# Patient Record
Sex: Male | Born: 1975 | Hispanic: No | Marital: Married | Smoking: Light tobacco smoker
Health system: Southern US, Community
[De-identification: ages and names within clinical notes are randomized; demographics above are authoritative.]

## PROBLEM LIST (undated history)

## (undated) DIAGNOSIS — K529 Noninfective gastroenteritis and colitis, unspecified: Secondary | ICD-10-CM

## (undated) DIAGNOSIS — J45909 Unspecified asthma, uncomplicated: Secondary | ICD-10-CM

## (undated) DIAGNOSIS — K51911 Ulcerative colitis, unspecified with rectal bleeding: Secondary | ICD-10-CM

## (undated) DIAGNOSIS — M1990 Unspecified osteoarthritis, unspecified site: Secondary | ICD-10-CM

## (undated) HISTORY — DX: Unspecified asthma, uncomplicated: J45.909

## (undated) HISTORY — PX: WISDOM TOOTH EXTRACTION: SHX21

## (undated) HISTORY — DX: Noninfective gastroenteritis and colitis, unspecified: K52.9

## (undated) HISTORY — DX: Unspecified osteoarthritis, unspecified site: M19.90

## (undated) HISTORY — PX: ANKLE FRACTURE SURGERY: SHX122

## (undated) HISTORY — PX: CLAVICLE SURGERY: SHX598

## (undated) HISTORY — DX: Ulcerative colitis, unspecified with rectal bleeding: K51.911

## (undated) HISTORY — PX: COLONOSCOPY: SHX174

---

## 2014-11-02 ENCOUNTER — Ambulatory Visit (INDEPENDENT_AMBULATORY_CARE_PROVIDER_SITE_OTHER): Payer: BLUE CROSS/BLUE SHIELD | Admitting: Family Medicine

## 2014-11-02 VITALS — BP 108/82 | HR 52 | Temp 98.0°F | Resp 15 | Ht 74.0 in | Wt 218.0 lb

## 2014-11-02 DIAGNOSIS — R197 Diarrhea, unspecified: Secondary | ICD-10-CM | POA: Diagnosis not present

## 2014-11-02 DIAGNOSIS — R1084 Generalized abdominal pain: Secondary | ICD-10-CM | POA: Diagnosis not present

## 2014-11-02 DIAGNOSIS — K625 Hemorrhage of anus and rectum: Secondary | ICD-10-CM | POA: Diagnosis not present

## 2014-11-02 LAB — POCT CBC
GRANULOCYTE PERCENT: 57.6 % (ref 37–80)
HCT, POC: 45.5 % (ref 43.5–53.7)
HEMOGLOBIN: 15.4 g/dL (ref 14.1–18.1)
LYMPH, POC: 2.1 (ref 0.6–3.4)
MCH: 29.7 pg (ref 27–31.2)
MCHC: 33.9 g/dL (ref 31.8–35.4)
MCV: 87.7 fL (ref 80–97)
MID (CBC): 0.6 (ref 0–0.9)
MPV: 6 fL (ref 0–99.8)
POC Granulocyte: 3.6 (ref 2–6.9)
POC LYMPH PERCENT: 33.4 %L (ref 10–50)
POC MID %: 9 % (ref 0–12)
Platelet Count, POC: 239 10*3/uL (ref 142–424)
RBC: 5.18 M/uL (ref 4.69–6.13)
RDW, POC: 12.6 %
WBC: 6.2 10*3/uL (ref 4.6–10.2)

## 2014-11-02 LAB — HEMOCCULT GUIAC POC 1CARD (OFFICE): Fecal Occult Blood, POC: POSITIVE — AB

## 2014-11-02 LAB — POCT SEDIMENTATION RATE: POCT SED RATE: 5 mm/h (ref 0–22)

## 2014-11-02 MED ORDER — PANTOPRAZOLE SODIUM 40 MG PO TBEC: 40.0000 mg | DELAYED_RELEASE_TABLET | Freq: Every day | ORAL | Status: DC

## 2014-11-02 MED ORDER — DICYCLOMINE HCL 20 MG PO TABS: 20.0000 mg | ORAL_TABLET | Freq: Three times a day (TID) | ORAL | Status: DC

## 2014-11-02 NOTE — Patient Instructions (Signed)
I do NOT think you have a viral gastroenteriritis - you would have been feeling better by now - but you might have a post-infectious inflammation of your intestines or have picked up an actual infection so bring back the stool tests tomorrow and then we will know whether to treat you to a course of antibiotics.  Start the protonix since you are bleeding.  Gentle wiping.  Sitz baths, topical witchhazel can be useful.  Do not use hemorrhoid medicine. I will place a GI referral but we will hopefully be able to cancel this.   Viral Gastroenteritis Viral gastroenteritis is also known as stomach flu. This condition affects the stomach and intestinal tract. It can cause sudden diarrhea and vomiting. The illness typically lasts 3 to 8 days. Most people develop an immune response that eventually gets rid of the virus. While this natural response develops, the virus can make you quite ill. CAUSES  Many different viruses can cause gastroenteritis, such as rotavirus or noroviruses. You can catch one of these viruses by consuming contaminated food or water. You may also catch a virus by sharing utensils or other personal items with an infected person or by touching a contaminated surface. SYMPTOMS  The most common symptoms are diarrhea and vomiting. These problems can cause a severe loss of body fluids (dehydration) and a body salt (electrolyte) imbalance. Other symptoms may include:  Fever.  Headache.  Fatigue.  Abdominal pain. DIAGNOSIS  Your caregiver can usually diagnose viral gastroenteritis based on your symptoms and a physical exam. A stool sample may also be taken to test for the presence of viruses or other infections. TREATMENT  This illness typically goes away on its own. Treatments are aimed at rehydration. The most serious cases of viral gastroenteritis involve vomiting so severely that you are not able to keep fluids down. In these cases, fluids must be given through an intravenous line  (IV). HOME CARE INSTRUCTIONS   Drink enough fluids to keep your urine clear or pale yellow. Drink small amounts of fluids frequently and increase the amounts as tolerated.  Ask your caregiver for specific rehydration instructions.  Avoid:  Foods high in sugar.  Alcohol.  Carbonated drinks.  Tobacco.  Juice.  Caffeine drinks.  Extremely hot or cold fluids.  Fatty, greasy foods.  Too much intake of anything at one time.  Dairy products until 24 to 48 hours after diarrhea stops.  You may consume probiotics. Probiotics are active cultures of beneficial bacteria. They may lessen the amount and number of diarrheal stools in adults. Probiotics can be found in yogurt with active cultures and in supplements.  Wash your hands well to avoid spreading the virus.  Only take over-the-counter or prescription medicines for pain, discomfort, or fever as directed by your caregiver. Do not give aspirin to children. Antidiarrheal medicines are not recommended.  Ask your caregiver if you should continue to take your regular prescribed and over-the-counter medicines.  Keep all follow-up appointments as directed by your caregiver. SEEK IMMEDIATE MEDICAL CARE IF:   You are unable to keep fluids down.  You do not urinate at least once every 6 to 8 hours.  You develop shortness of breath.  You notice blood in your stool or vomit. This may look like coffee grounds.  You have abdominal pain that increases or is concentrated in one small area (localized).  You have persistent vomiting or diarrhea.  You have a fever.  The patient is a child younger than 3 months, and he  or she has a fever.  The patient is a child older than 3 months, and he or she has a fever and persistent symptoms.  The patient is a child older than 3 months, and he or she has a fever and symptoms suddenly get worse.  The patient is a baby, and he or she has no tears when crying. MAKE SURE YOU:   Understand these  instructions.  Will watch your condition.  Will get help right away if you are not doing well or get worse. Document Released: 04/08/2005 Document Revised: 07/01/2011 Document Reviewed: 01/23/2011 [facility] Patient Information 2015 [place], [place]. This information is not intended to replace advice given to you by your health care provider. Make sure you discuss any questions you have with your health care provider. Bloody Diarrhea Bloody diarrhea can be caused by many different conditions. Most of the time bloody diarrhea is the result of food poisoning or minor infections. Bloody diarrhea usually improves over 2 to 3 days of rest and fluid replacement. Other conditions that can cause bloody diarrhea include:  Internal bleeding.  Infection.  Diseases of the bowel and colon. Internal bleeding from an ulcer or bowel disease can be severe and requires hospital care or even surgery. DIAGNOSIS  To find out what is wrong your caregiver may check your:  Stool.  Blood.  Results from a test that looks inside the body (endoscopy). TREATMENT   Get plenty of rest.  Drink enough water and fluids to keep your urine clear or pale yellow.  Do not smoke.  Solid foods and dairy products should be avoided until your illness improves.  As you improve, slowly return to a regular diet with easily-digested foods first. Examples are:  Bananas.  Rice.  Toast.  Crackers. You should only need these for about 2 days before adding more normal foods to your diet.  Avoid spicy or fatty foods as well as caffeine and alcohol for several days.  Medicine to control cramping and diarrhea can relieve symptoms but may prolong some cases of bloody diarrhea. Antibiotics can speed recovery from diarrhea due to some bacterial infections. Call your caregiver if diarrhea does not get better in 3 days. SEEK MEDICAL CARE IF:   You do not improve after 3 days.  Your diarrhea improves but your stool appears  black. SEEK IMMEDIATE MEDICAL CARE IF:   You become extremely weak or faint.  You become very sweaty.  You have increased pain or bleeding.  You develop repeated vomiting.  You vomit and you see blood or the vomit looks black in color.  You have a fever. Document Released: 04/08/2005 Document Revised: 07/01/2011 Document Reviewed: 03/10/2009 [facility] Patient Information 2015 [place], [place]. This information is not intended to replace advice given to you by your health care provider. Make sure you discuss any questions you have with your health care provider. Food Choices to Help Relieve Diarrhea When you have diarrhea, the foods you eat and your eating habits are very important. Choosing the right foods and drinks can help relieve diarrhea. Also, because diarrhea can last up to 7 days, you need to replace lost fluids and electrolytes (such as sodium, potassium, and chloride) in order to help prevent dehydration.  WHAT GENERAL GUIDELINES DO I NEED TO FOLLOW?  Slowly drink 1 cup (8 oz) of fluid for each episode of diarrhea. If you are getting enough fluid, your urine will be clear or pale yellow.  Eat starchy foods. Some good choices include white rice, white  toast, pasta, low-fiber cereal, baked potatoes (without the skin), saltine crackers, and bagels.  Avoid large servings of any cooked vegetables.  Limit fruit to two servings per day. A serving is  cup or 1 small piece.  Choose foods with less than 2 g of fiber per serving.  Limit fats to less than 8 tsp (38 g) per day.  Avoid fried foods.  Eat foods that have probiotics in them. Probiotics can be found in certain dairy products.  Avoid foods and beverages that may increase the speed at which food moves through the stomach and intestines (gastrointestinal tract). Things to avoid include:  High-fiber foods, such as dried fruit, raw fruits and vegetables, nuts, seeds, and whole grain foods.  Spicy foods and high-fat  foods.  Foods and beverages sweetened with high-fructose corn syrup, honey, or sugar alcohols such as xylitol, sorbitol, and mannitol. WHAT FOODS ARE RECOMMENDED? Grains White rice. White, [place], or pita breads (fresh or toasted), including plain rolls, buns, or bagels. White pasta. Saltine, soda, or graham crackers. Pretzels. Low-fiber cereal. Cooked cereals made with water (such as cornmeal, farina, or cream cereals). Plain muffins. Matzo. Melba toast. Zwieback.  Vegetables Potatoes (without the skin). Strained tomato and vegetable juices. Most well-cooked and canned vegetables without seeds. Tender lettuce. Fruits Cooked or canned applesauce, apricots, cherries, fruit cocktail, grapefruit, peaches, pears, or plums. Fresh bananas, apples without skin, cherries, grapes, cantaloupe, grapefruit, peaches, oranges, or plums.  Meat and Other Protein Products Baked or boiled chicken. Eggs. Tofu. Fish. Seafood. Smooth peanut butter. Ground or well-cooked tender beef, ham, veal, lamb, pork, or poultry.  Dairy Plain yogurt, kefir, and unsweetened liquid yogurt. Lactose-free milk, buttermilk, or soy milk. Plain hard cheese. Beverages Sport drinks. Clear broths. Diluted fruit juices (except prune). Regular, caffeine-free sodas such as ginger ale. Water. Decaffeinated teas. Oral rehydration solutions. Sugar-free beverages not sweetened with sugar alcohols. Other Bouillon, broth, or soups made from recommended foods.  The items listed above may not be a complete list of recommended foods or beverages. Contact your dietitian for more options. WHAT FOODS ARE NOT RECOMMENDED? Grains Whole grain, whole wheat, bran, or rye breads, rolls, pastas, crackers, and cereals. Wild or brown rice. Cereals that contain more than 2 g of fiber per serving. Corn tortillas or taco shells. Cooked or dry oatmeal. Granola. Popcorn. Vegetables Raw vegetables. Cabbage, broccoli, Brussels sprouts, artichokes, baked beans, beet  greens, corn, kale, legumes, peas, sweet potatoes, and yams. Potato skins. Cooked spinach and cabbage. Fruits Dried fruit, including raisins and dates. Raw fruits. Stewed or dried prunes. Fresh apples with skin, apricots, mangoes, pears, raspberries, and strawberries.  Meat and Other Protein Products Chunky peanut butter. Nuts and seeds. Beans and lentils. Berniece Salines.  Dairy High-fat cheeses. Milk, chocolate milk, and beverages made with milk, such as milk shakes. Cream. Ice cream. Sweets and Desserts Sweet rolls, doughnuts, and sweet breads. Pancakes and waffles. Fats and Oils Butter. Cream sauces. Margarine. Salad oils. Plain salad dressings. Olives. Avocados.  Beverages Caffeinated beverages (such as coffee, tea, soda, or energy drinks). Alcoholic beverages. Fruit juices with pulp. Prune juice. Soft drinks sweetened with high-fructose corn syrup or sugar alcohols. Other Coconut. Hot sauce. Chili powder. Mayonnaise. Gravy. Cream-based or milk-based soups.  The items listed above may not be a complete list of foods and beverages to avoid. Contact your dietitian for more information. WHAT SHOULD I DO IF I BECOME DEHYDRATED? Diarrhea can sometimes lead to dehydration. Signs of dehydration include dark urine and dry mouth and skin. If  you think you are dehydrated, you should rehydrate with an oral rehydration solution. These solutions can be purchased at pharmacies, retail stores, or online.  Drink -1 cup (120-240 mL) of oral rehydration solution each time you have an episode of diarrhea. If drinking this amount makes your diarrhea worse, try drinking smaller amounts more often. For example, drink 1-3 tsp (5-15 mL) every 5-10 minutes.  A general rule for staying hydrated is to drink 1-2 L of fluid per day. Talk to your health care provider about the specific amount you should be drinking each day. Drink enough fluids to keep your urine clear or pale yellow. Document Released: 06/29/2003 Document  Revised: 04/13/2013 Document Reviewed: 03/01/2013 [facility] Patient Information 2015 [place], [place]. This information is not intended to replace advice given to you by your health care provider. Make sure you discuss any questions you have with your health care provider.

## 2014-11-02 NOTE — Progress Notes (Addendum)
Subjective:  This chart was scribed for Delman Cheadle, MD by Thea Alken, ED Scribe. This patient was seen in room 3 and the patient's care was started at 4:31 PM.  Patient ID: Matthew Holt, male    DOB: 12-12-1975, 39 y.o.   MRN: [id number]  HPI   Chief Complaint  Patient presents with  . Establish Care    With Dr. Brigitte Pulse   HPI Comments: Matthew Holt is a 39 y.o. male who presents to the [facility] to establish care. Pt recently moved here from [place], [place]. He has hx of asthma but has not had a flare in several years. Father has hx of prostate cancer, diagnosed in his late 46 and skin problem, 4 years ago. Pt denies regular medication but takes vitamins and protein supplements. Pt works for a [employer].  Pt c/o generalized abdominal cramping with associated abdominal distention. Pt states since returning from [place] for vacation he has had abdominal cramping immediately after eating fried foods. He states after cramping he has a loose BM, no diarrhea, with occasional blood in stools and sometimes dark tarry stools. He has tried pepto bismol without relief to symptoms. He has had similar symptoms in the past but not as "heavy" or frequent as current symptoms. He denies nausea, emesis, constipation and diarrhea. He denies heart burn and indigestion.     Past Medical History  Diagnosis Date  . Asthma    History reviewed. No pertinent past surgical history. Prior to Admission medications   Not on File   Review of Systems  Constitutional: Negative for fever and chills.  Cardiovascular: Negative for chest pain.  Gastrointestinal: Positive for abdominal pain, blood in stool, abdominal distention and anal bleeding. Negative for nausea, vomiting, diarrhea and constipation.  Genitourinary: Negative for dysuria, urgency and difficulty urinating.   Objective:  Physical Exam  Constitutional: He is oriented to person, place, and time. He appears well-developed and  well-nourished. No distress.  HENT:  Head: Normocephalic and atraumatic.  Eyes: Conjunctivae and EOM are normal.  Neck: Neck supple.  Cardiovascular: Normal rate, regular rhythm, S1 normal, S2 normal and normal heart sounds.   No murmur heard. Pulmonary/Chest: Effort normal and breath sounds normal. No respiratory distress. He has no wheezes. He has no rales. He exhibits no tenderness.  Abdominal: Soft. Bowel sounds are normal. He exhibits no distension. There is no hepatosplenomegaly, splenomegaly or hepatomegaly. There is no tenderness.  Negative Murphy's.   Genitourinary: Prostate normal. Rectal exam shows tenderness. Rectal exam shows no external hemorrhoid, no internal hemorrhoid, no fissure, no mass and anal tone normal. Guaiac positive stool. Prostate is not enlarged and not tender.  Musculoskeletal: Normal range of motion.  Neurological: He is alert and oriented to person, place, and time.  Skin: Skin is warm and dry.  Psychiatric: He has a normal mood and affect. His behavior is normal.  Nursing note and vitals reviewed.   Filed Vitals:   11/02/14 1619  BP: 108/82  Pulse: 52  Temp: 98 F (36.7 C)  TempSrc: Oral  Resp: 15  Height: 6\' 2"  (1.88 m)  Weight: 218 lb (98.884 kg)  SpO2: 99%   Results for orders placed or performed in visit on 11/02/14  POCT SEDIMENTATION RATE  Result Value Ref Range   POCT SED RATE 5 0 - 22 mm/hr  POCT CBC  Result Value Ref Range   WBC 6.2 4.6 - 10.2 K/uL   Lymph, poc 2.1 0.6 - 3.4  POC LYMPH PERCENT 33.4 10 - 50 %L   MID (cbc) 0.6 0 - 0.9   POC MID % 9.0 0 - 12 %M   POC Granulocyte 3.6 2 - 6.9   Granulocyte percent 57.6 37 - 80 %G   RBC 5.18 4.69 - 6.13 M/uL   Hemoglobin 15.4 14.1 - 18.1 g/dL   HCT, POC 45.5 43.5 - 53.7 %   MCV 87.7 80 - 97 fL   MCH, POC 29.7 27 - 31.2 pg   MCHC 33.9 31.8 - 35.4 g/dL   RDW, POC 12.6 %   Platelet Count, POC 239 142 - 424 K/uL   MPV 6.0 0 - 99.8 fL  Hemoccult - 1 Card (office)  Result Value Ref  Range   Fecal Occult Blood, POC Positive (A) Negative   Card #1 Date 11/02/2014    Card #2 Fecal Occult Blod, POC     Card #2 Date     Card #3 Fecal Occult Blood, POC     Card #3 Date     Assessment & Plan:   1. Rectal bleeding - unknown etiology but suspect rectal due to hx and sxs - try sitz baths, high fiber, use stool softener prn. RTC if cont or worsens.  2. Generalized abdominal pain   3. Diarrhea - since [place] - no other family memebers w/ sxs - collect stools studes. + lactoferrin so will treat with travelors diarrhea with cipro 750 bid x 3d - RTC or f/u w/ GI if sxs cont    Orders Placed This Encounter  Procedures  . Stool culture  . Ova and parasite examination  . Clostridium Difficile by PCR (not at [facility])  . Comprehensive metabolic panel  . Lipase  . Fecal lactoferrin  . Ambulatory referral to Gastroenterology    Referral Priority:  Routine    Referral Type:  Consultation    Referral Reason:  Specialty Services Required    Number of Visits Requested:  1  . POCT SEDIMENTATION RATE  . POCT CBC  . Hemoccult - 1 Card (office)    Meds ordered this encounter  Medications  . pantoprazole (PROTONIX) 40 MG tablet    Sig: Take 1 tablet (40 mg total) by mouth daily. 30 minutes before breakfast    Dispense:  30 tablet    Refill:  3  . dicyclomine (BENTYL) 20 MG tablet    Sig: Take 1 tablet (20 mg total) by mouth 4 (four) times daily -  before meals and at bedtime. As needed for stomach cramping.    Dispense:  40 tablet    Refill:  1    I personally performed the services described in this documentation, which was scribed in my presence. The recorded information has been reviewed and considered, and addended by me as needed.  Delman Cheadle, MD MPH

## 2014-11-03 LAB — COMPREHENSIVE METABOLIC PANEL
ALK PHOS: 76 U/L (ref 39–117)
ALT: 36 U/L (ref 0–53)
AST: 34 U/L (ref 0–37)
Albumin: 4.1 g/dL (ref 3.5–5.2)
BILIRUBIN TOTAL: 0.6 mg/dL (ref 0.2–1.2)
BUN: 22 mg/dL (ref 6–23)
CO2: 25 mEq/L (ref 19–32)
Calcium: 9.3 mg/dL (ref 8.4–10.5)
Chloride: 103 mEq/L (ref 96–112)
Creat: 1.29 mg/dL (ref 0.50–1.35)
Glucose, Bld: 71 mg/dL (ref 70–99)
Potassium: 4.2 mEq/L (ref 3.5–5.3)
Sodium: 140 mEq/L (ref 135–145)
Total Protein: 7 g/dL (ref 6.0–8.3)

## 2014-11-03 LAB — LIPASE: Lipase: 40 U/L (ref 0–75)

## 2014-11-05 LAB — CLOSTRIDIUM DIFFICILE BY PCR: CDIFFPCR: NOT DETECTED

## 2014-11-05 LAB — FECAL LACTOFERRIN, QUANT: LACTOFERRIN: POSITIVE

## 2014-11-07 ENCOUNTER — Encounter: Payer: Self-pay | Admitting: Family Medicine

## 2014-11-07 ENCOUNTER — Telehealth: Payer: Self-pay | Admitting: Family Medicine

## 2014-11-07 MED ORDER — CIPROFLOXACIN HCL 750 MG PO TABS: 750.0000 mg | ORAL_TABLET | Freq: Two times a day (BID) | ORAL | Status: DC

## 2014-11-07 NOTE — Addendum Note (Signed)
Addended by: Delman Cheadle on: 11/07/2014 04:14 PM   Modules accepted: Orders

## 2014-11-07 NOTE — Telephone Encounter (Signed)
Sent pt a lab letter in the mail but closed results before forwarding - please call pt and let him know that he should start cipro bid x 3d for his diarrhea - antibiotic at pharmacy.

## 2014-11-08 ENCOUNTER — Ambulatory Visit (INDEPENDENT_AMBULATORY_CARE_PROVIDER_SITE_OTHER): Payer: BLUE CROSS/BLUE SHIELD | Admitting: Nurse Practitioner

## 2014-11-08 ENCOUNTER — Encounter: Payer: Self-pay | Admitting: Physician Assistant

## 2014-11-08 ENCOUNTER — Encounter: Payer: Self-pay | Admitting: Nurse Practitioner

## 2014-11-08 ENCOUNTER — Telehealth: Payer: Self-pay | Admitting: Nurse Practitioner

## 2014-11-08 VITALS — BP 120/76 | HR 68 | Ht 75.0 in | Wt 221.0 lb

## 2014-11-08 DIAGNOSIS — K625 Hemorrhage of anus and rectum: Secondary | ICD-10-CM | POA: Diagnosis not present

## 2014-11-08 LAB — OVA AND PARASITE EXAMINATION: OP: NONE SEEN

## 2014-11-08 MED ORDER — HYDROCORTISONE 2.5 % RE CREA: TOPICAL_CREAM | RECTAL | Status: DC

## 2014-11-08 NOTE — Progress Notes (Addendum)
HPI :   Patient is a 39 year old male referred by PCP.  In late June, just prior to a trip to [place] he had small amount of blood in his otherwise normal bowel movement. After returning from [place] he developed abdominal pain and loose stool with blood. Patient went to see his PCP, fecal occult blood and lactoferrin were positive. Stool for O &P and culture are still pending. C. Difficile negative. CBC normal. Patient was given bentyl and Cipro but didn't take them as  diarrhea and cramps resolved spontaneously.  Patient comes in today for evaluation of persistent intermittent rectal bleeding. The blood is bright red, surrounds the feces. Patient has no abdominal pain or rectal pain. He describes a history of similar rectal bleeding 6 years ago in [place]. Colonoscopy apparently revealed anal fissure,  possibly a polyp (patient cannot remember for sure).   Past Medical History  Diagnosis Date  . Asthma     Family History  Problem Relation Age of Onset  . Cancer Father   . Heart disease Maternal Grandfather    History  Substance Use Topics  . Smoking status: Never Smoker   . Smokeless tobacco: Not on file  . Alcohol Use: 1.2 oz/week    2 Standard drinks or equivalent per week   Current Outpatient Prescriptions  Medication Sig Dispense Refill  . hydrocortisone (ANUSOL-HC) 2.5 % rectal cream Use 1 application rectally at bedtime for 10 days. 30 g 0   No current facility-administered medications for this visit.   No Known Allergies   Review of Systems: All systems reviewed and negative except where noted in HPI.   Physical Exam: BP 120/76 mmHg  Pulse 68  Ht 6\' 3"  (1.905 m)  Wt 221 lb (100.245 kg)  BMI 27.62 kg/m2 Constitutional: Pleasant,well-developed, male in no acute distress. HEENT: Normocephalic and atraumatic. Conjunctivae are normal. No scleral icterus. Neck supple.  Cardiovascular: Normal rate, regular rhythm.  Pulmonary/chest: Effort normal and breath  sounds normal. No wheezing, rales or rhonchi. Abdominal: Soft, nondistended, nontender. Bowel sounds active throughout. There are no masses palpable. No hepatomegaly. Rectal exam: he declined Extremities: no edema Lymphadenopathy: No cervical adenopathy noted. Neurological: Alert and oriented to person place and time. Skin: Skin is warm and dry. No rashes noted. Psychiatric: Normal mood and affect. Behavior is normal.   ASSESSMENT AND PLAN:  1.  Pleasant 39 year old male with recent crampy diarrhea with blood after returning from vacation. His stool lactoferrin was positive, culture and O&P are still pending. Diarrhea and cramps have totally resolved ( he did not need antibiotics or the dicyclomine prescribed by PCP).  Suspect  resolving infectious process  2.  Painless rectal bleeding. Blood in stool has persisted and patient actually noticed a small amount of blood in stool even prior to diarrhea illness. (see#1).  This could be perianal irritation. Patient uncomfortable with repeat rectal exam, PCP already did one. He had a colonoscopy 6 years ago in [place] for rectal bleeding, apparently an anal fissure was found. Patient has no rectal pain making fissure less likely. Will treat empirically for internal hemorrhoids. Patient will call me in 10 days with condition update. In the interim I will request colonoscopy report from [place]. If bleeding persists then he will definitely need flexible sigmoidoscopy if not complete colonoscopy.   Cc: Delman Cheadle, MD  Addendum: unable to locate colonoscopy from [place]. Will make sure patient has a follow up appointment with Dr. Carlean Purl or myself. If not better then will arrange for  colonoscopy.

## 2014-11-08 NOTE — Patient Instructions (Addendum)
We sent a prescription for Anusol HC Cream to use at bedtime for 10 days. [facility] [address], [place]. If copay is too high, you can get Preperation H Cream, maximum strength.   Call us in 10 days with a progress report.

## 2014-11-08 NOTE — Telephone Encounter (Signed)
Error

## 2014-11-08 NOTE — Telephone Encounter (Signed)
A user error has taken place.

## 2014-11-09 ENCOUNTER — Encounter: Payer: Self-pay | Admitting: Nurse Practitioner

## 2014-11-09 LAB — STOOL CULTURE

## 2014-11-10 NOTE — Telephone Encounter (Signed)
Clld pt - Advsd of ABx Cipro bid x 3 dys being electronically sent to [facility]. Pt advsd that he did not get Rx due to diarrhea and stomach cramps subsiding. Pt advsd he did see [facility] on Tuesday, 11/08/14  - per pt they did map out a game plan for future issues and he will follow up with her if necessary. Pt advsd that he did drop off stool samples to [facility] on Saturday, 11/05/14 - requesting results. Advsd I would forward req to provider. After hanging up from pt reviewed lab results - provider notation to pt. Clld pt back to give lab results - LMOVM of cell to cll back for results.

## 2014-11-14 NOTE — Progress Notes (Signed)
Agree with Ms. Guenther's assessment and plan. Gatha Mayer, MD, [facility]   He should have a f/u visit in Sept/Oct me or APP

## 2014-11-15 ENCOUNTER — Telehealth: Payer: Self-pay

## 2014-11-15 NOTE — Telephone Encounter (Signed)
Left message for patient to call back  

## 2014-11-15 NOTE — Telephone Encounter (Signed)
-----   Message from Willia Craze, NP sent at 11/15/2014  1:49 PM EDT ----- Barbera Setters, We couldn't locate colon report from [place]. Please make sure patient comes back for a follow up with gessner or me. If not better will consider colonoscopy. Thanks

## 2014-11-18 NOTE — Telephone Encounter (Signed)
Left message for patient to call back  

## 2014-11-21 NOTE — Telephone Encounter (Signed)
No return call from the patient.  i have mailed him a letter asking he call to set up a follow up

## 2015-01-30 ENCOUNTER — Ambulatory Visit: Payer: BLUE CROSS/BLUE SHIELD | Admitting: Internal Medicine

## 2015-02-21 ENCOUNTER — Ambulatory Visit: Payer: BLUE CROSS/BLUE SHIELD | Admitting: Internal Medicine

## 2015-10-17 ENCOUNTER — Telehealth: Payer: Self-pay | Admitting: Internal Medicine

## 2015-10-17 NOTE — Telephone Encounter (Signed)
Left a message for patient to call back. 

## 2015-10-19 NOTE — Telephone Encounter (Signed)
Dr Gessner is this ok with you? 

## 2015-10-24 NOTE — Telephone Encounter (Signed)
ok 

## 2015-10-25 NOTE — Telephone Encounter (Signed)
Dr. Nandigam will you accept this patient? °

## 2015-10-30 NOTE — Telephone Encounter (Signed)
Appointment has been scheduled with a PA.

## 2015-10-30 NOTE — Telephone Encounter (Signed)
ok 

## 2015-11-07 ENCOUNTER — Encounter: Payer: Self-pay | Admitting: Gastroenterology

## 2015-11-07 ENCOUNTER — Other Ambulatory Visit (INDEPENDENT_AMBULATORY_CARE_PROVIDER_SITE_OTHER): Payer: BLUE CROSS/BLUE SHIELD

## 2015-11-07 ENCOUNTER — Ambulatory Visit (INDEPENDENT_AMBULATORY_CARE_PROVIDER_SITE_OTHER): Payer: BLUE CROSS/BLUE SHIELD | Admitting: Gastroenterology

## 2015-11-07 VITALS — BP 120/64 | HR 68 | Ht 75.0 in | Wt 233.9 lb

## 2015-11-07 DIAGNOSIS — R197 Diarrhea, unspecified: Secondary | ICD-10-CM

## 2015-11-07 DIAGNOSIS — R109 Unspecified abdominal pain: Secondary | ICD-10-CM | POA: Diagnosis not present

## 2015-11-07 DIAGNOSIS — K625 Hemorrhage of anus and rectum: Secondary | ICD-10-CM

## 2015-11-07 LAB — CBC WITH DIFFERENTIAL/PLATELET
BASOS ABS: 0 10*3/uL (ref 0.0–0.1)
BASOS PCT: 0.3 % (ref 0.0–3.0)
EOS ABS: 0.2 10*3/uL (ref 0.0–0.7)
Eosinophils Relative: 2.8 % (ref 0.0–5.0)
HEMATOCRIT: 45.7 % (ref 39.0–52.0)
HEMOGLOBIN: 15.6 g/dL (ref 13.0–17.0)
LYMPHS PCT: 16 % (ref 12.0–46.0)
Lymphs Abs: 1.4 10*3/uL (ref 0.7–4.0)
MCHC: 34.1 g/dL (ref 30.0–36.0)
MCV: 88.7 fl (ref 78.0–100.0)
Monocytes Absolute: 0.5 10*3/uL (ref 0.1–1.0)
Monocytes Relative: 6 % (ref 3.0–12.0)
Neutro Abs: 6.4 10*3/uL (ref 1.4–7.7)
Neutrophils Relative %: 74.9 % (ref 43.0–77.0)
Platelets: 241 10*3/uL (ref 150.0–400.0)
RBC: 5.15 Mil/uL (ref 4.22–5.81)
RDW: 13.4 % (ref 11.5–15.5)
WBC: 8.5 10*3/uL (ref 4.0–10.5)

## 2015-11-07 LAB — COMPREHENSIVE METABOLIC PANEL
ALBUMIN: 4.2 g/dL (ref 3.5–5.2)
ALT: 37 U/L (ref 0–53)
AST: 35 U/L (ref 0–37)
Alkaline Phosphatase: 79 U/L (ref 39–117)
BILIRUBIN TOTAL: 0.8 mg/dL (ref 0.2–1.2)
BUN: 26 mg/dL — ABNORMAL HIGH (ref 6–23)
CALCIUM: 9.5 mg/dL (ref 8.4–10.5)
CO2: 30 mEq/L (ref 19–32)
CREATININE: 1.18 mg/dL (ref 0.40–1.50)
Chloride: 104 mEq/L (ref 96–112)
GFR: 73.32 mL/min (ref >60.00)
Glucose, Bld: 85 mg/dL (ref 70–99)
Potassium: 4.2 mEq/L (ref 3.5–5.1)
Sodium: 138 mEq/L (ref 135–145)
TOTAL PROTEIN: 7.2 g/dL (ref 6.0–8.3)

## 2015-11-07 LAB — IGA: IgA: 214 mg/dL (ref 68–378)

## 2015-11-07 LAB — HIGH SENSITIVITY CRP: CRP, High Sensitivity: 0.85 mg/L (ref 0.000–5.000)

## 2015-11-07 LAB — SEDIMENTATION RATE: SED RATE: 6 mm/h (ref 0–15)

## 2015-11-07 LAB — TSH: TSH: 2.77 u[IU]/mL (ref 0.35–4.50)

## 2015-11-07 MED ORDER — NA SULFATE-K SULFATE-MG SULF 17.5-3.13-1.6 GM/177ML PO SOLN: 1.0000 | Freq: Once | ORAL | Status: DC

## 2015-11-07 NOTE — Progress Notes (Signed)
     11/07/2015 MCKAY FITZNER [id number] 10/30/1975   History of Present Illness:  This is a 40 year old male who was previously assigned to Dr. Carlean Purl, but requested to switch to Dr. Silverio Decamp since he wanted a male physician.  Anyway, he is here today with complaints of rectal bleeding, abdominal pain, diarrhea, and gas/bloating. He says that his abdominal pain is what he would describe is just a diffuse/generalized discomfort.  Says that he has bowel movements sometimes 4 or 5 times a day and they tend to mostly be loose with mucus. Has varying degrees of bleeding. He did have a colonoscopy about 7 years ago in [place] and we tried to obtain those results last year but were unsuccessful. He reports that it was normal. He comes in today stating that he looked on [employer] [facility] and thinks that he has either Crohn's or colitis. He was seen last year for what was suspected to be rectal bleeding related to a fissure and some postinfectious type symptoms. Plan was to repeat colonoscopy if symptoms persisted, however.  He is requesting colonoscopy.   Current Medications, Allergies, Past Medical History, Past Surgical History, Family History and Social History were reviewed in [employer] record.   Physical Exam: BP 120/64 mmHg  Pulse 68  Ht 6\' 3"  (1.905 m)  Wt 233 lb 14.4 oz (106.096 kg)  BMI 29.24 kg/m2 General: Well developed male in no acute distress Head: Normocephalic and atraumatic Eyes:  Sclerae anicteric, conjunctiva pink  Ears: Normal auditory acuity Lungs: Clear throughout to auscultation Heart: Regular rate and rhythm Abdomen: Soft, non-distended.  Normal bowel sounds.  Non-tender. Rectal:  Will be done at the time of colonoscopy. Musculoskeletal: Symmetrical with no gross deformities  Extremities: No edema  Neurological: Alert oriented x 4, grossly non-focal Psychological:  Alert and cooperative. Normal mood and affect  Assessment and  Recommendations: -40 year old male with diffuse abdominal discomfort, diarrhea, rectal bleeding.  Had colonoscopy 7 years ago in [place] from which we're not able to obtain results. Was reportedly normal. We'll schedule for colonoscopy with Dr. Silverio Decamp to rule out IBD, etc.  we'll check labs today including CBC, CMP, TSH, sedimentation rate and CRP, and celiac labs.  ? If bleeding is just due to hemorrhoids and he may have some type of postinfectious IBS then may need hemorrhoid banding and possible course of Xifaxan or Flagyl if colonoscopy and labs prove negative.  *The risks, benefits, and alternatives to colonoscopy were discussed with the patient and he consents to proceed.

## 2015-11-07 NOTE — Patient Instructions (Signed)
You have been scheduled for a colonoscopy. Please follow written instructions given to you at your visit today.  Please pick up your prep supplies at the pharmacy within the next 1-3 days. If you use inhalers (even only as needed), please bring them with you on the day of your procedure. Your physician has requested that you go to www.startemmi.com and enter the access code given to you at your visit today. This web site gives a general overview about your procedure. However, you should still follow specific instructions given to you by our office regarding your preparation for the procedure.  Your physician has requested that you go to the basement for lab work before leaving today.  

## 2015-11-07 NOTE — Progress Notes (Signed)
Reviewed and agree with documentation and assessment and plan. K. Veena Nandigam , MD   

## 2015-11-08 LAB — TISSUE TRANSGLUTAMINASE, IGA: TISSUE TRANSGLUTAMINASE AB, IGA: 1 U/mL (ref <4)

## 2015-11-16 ENCOUNTER — Ambulatory Visit (AMBULATORY_SURGERY_CENTER): Payer: BLUE CROSS/BLUE SHIELD | Admitting: Gastroenterology

## 2015-11-16 ENCOUNTER — Encounter: Payer: Self-pay | Admitting: Gastroenterology

## 2015-11-16 VITALS — BP 100/61 | HR 63 | Temp 98.4°F | Resp 16 | Ht 75.0 in | Wt 244.0 lb

## 2015-11-16 DIAGNOSIS — K639 Disease of intestine, unspecified: Secondary | ICD-10-CM | POA: Diagnosis not present

## 2015-11-16 DIAGNOSIS — R197 Diarrhea, unspecified: Secondary | ICD-10-CM

## 2015-11-16 MED ORDER — SODIUM CHLORIDE 0.9 % IV SOLN: INTRAVENOUS | Status: DC

## 2015-11-16 NOTE — Progress Notes (Signed)
A and O x3. Report to RN. Tolerated MAC anesthesia well. 

## 2015-11-16 NOTE — Patient Instructions (Signed)
     YOU HAD AN ENDOSCOPIC PROCEDURE TODAY AT [facility]:   Refer to the procedure report that was given to you for any specific questions about what was found during the examination.  If the procedure report does not answer your questions, please call your gastroenterologist to clarify.  If you requested that your care partner not be given the details of your procedure findings, then the procedure report has been included in a sealed envelope for you to review at your convenience later.  YOU SHOULD EXPECT: Some feelings of bloating in the abdomen. Passage of more gas than usual.  Walking can help get rid of the air that was put into your GI tract during the procedure and reduce the bloating. If you had a lower endoscopy (such as a colonoscopy or flexible sigmoidoscopy) you may notice spotting of blood in your stool or on the toilet paper. If you underwent a bowel prep for your procedure, you may not have a normal bowel movement for a few days.  Please Note:  You might notice some irritation and congestion in your nose or some drainage.  This is from the oxygen used during your procedure.  There is no need for concern and it should clear up in a day or so.  SYMPTOMS TO REPORT IMMEDIATELY:   Following lower endoscopy (colonoscopy or flexible sigmoidoscopy):  Excessive amounts of blood in the stool  Significant tenderness or worsening of abdominal pains  Swelling of the abdomen that is new, acute  Fever of 100F or higher   For urgent or emergent issues, a gastroenterologist can be reached at any hour by calling [phone].   DIET:  LACTOSE FREE DIET AVOID ARTIFICIAL SWEETNERS AVOID ANY NSAIDS (ADVIL,MOTRIN,IBUPROFEN, NAPROSYN,ALEVE, ETC.) AVOID OTC DIETARY SUPPLEMENTS  Your first meal following the procedure should be a small meal and then it is ok to progress to your normal diet. Heavy or fried foods are harder to digest and may make you feel nauseous or bloated.   Likewise, meals heavy in dairy and vegetables can increase bloating.  Drink plenty of fluids but you should avoid alcoholic beverages for 24 hours.  ACTIVITY:  You should plan to take it easy for the rest of today and you should NOT DRIVE or use heavy machinery until tomorrow (because of the sedation medicines used during the test).    FOLLOW UP: Our staff will call the number listed on your records the next business day following your procedure to check on you and address any questions or concerns that you may have regarding the information given to you following your procedure. If we do not reach you, we will leave a message.  However, if you are feeling well and you are not experiencing any problems, there is no need to return our call.  We will assume that you have returned to your regular daily activities without incident.  If any biopsies were taken you will be contacted by phone or by letter within the next 1-3 weeks.  Please call us at [phone] if you have not heard about the biopsies in 3 weeks.    SIGNATURES/CONFIDENTIALITY: You and/or your care partner have signed paperwork which will be entered into your electronic medical record.  These signatures attest to the fact that that the information above on your After Visit Summary has been reviewed and is understood.  Full responsibility of the confidentiality of this discharge information lies with you and/or your care-partner.

## 2015-11-16 NOTE — Progress Notes (Signed)
Called to room to assist during endoscopic procedure.  Patient ID and intended procedure confirmed with present staff. Received instructions for my participation in the procedure from the performing physician.  

## 2015-11-16 NOTE — Op Note (Signed)
[facility] Patient Name: Olumide Almonte Procedure Date: 11/16/2015 8:51 AM MRN: [id number] Endoscopist: Mauri Pole , MD Age: 40 Referring MD:  Date of Birth: 1976/01/01 Gender: Male Account #: [id number] Procedure:                Colonoscopy Indications:              Clinically significant diarrhea of unexplained                            origin Medicines:                Monitored Anesthesia Care Procedure:                Pre-Anesthesia Assessment:                           - Prior to the procedure, a History and Physical                            was performed, and patient medications and                            allergies were reviewed. The patient's tolerance of                            previous anesthesia was also reviewed. The risks                            and benefits of the procedure and the sedation                            options and risks were discussed with the patient.                            All questions were answered, and informed consent                            was obtained. Prior Anticoagulants: The patient has                            taken no previous anticoagulant or antiplatelet                            agents. ASA Grade Assessment: II - A patient with                            mild systemic disease. After reviewing the risks                            and benefits, the patient was deemed in                            satisfactory condition to undergo the procedure.  After obtaining informed consent, the colonoscope                            was passed under direct vision. Throughout the                            procedure, the patient's blood pressure, pulse, and                            oxygen saturations were monitored continuously. The                            Model CF-HQ190L [id number]) scope was introduced                            through the anus and advanced to the the terminal                          ileum, with identification of the appendiceal                            orifice and IC valve. The colonoscopy was performed                            without difficulty. The patient tolerated the                            procedure well. The quality of the bowel                            preparation was poor and unsatisfactory and 50                            percent obscured. The terminal ileum, ileocecal                            valve, appendiceal orifice, and rectum were                            photographed. Scope In: 9:03:21 AM Scope Out: 9:30:06 AM Scope Withdrawal Time: 0 hours 11 minutes 43 seconds  Total Procedure Duration: 0 hours 26 minutes 45 seconds  Findings:                 The perianal and digital rectal examinations were                            normal.                           A moderate amount of semi-liquid semi-solid stool                            was found in the entire colon, interfering with  visualization.                           A scattered area of mildly erythematous mucosa was                            found in the recto-sigmoid colon. Diminutive ulcers                            in the distal rectum ~1-17mm in size with clean                            base. Otherwise rest of the colonic mucusa and                            terminal ileum appeared normal.                           Biopsies were taken with a cold forceps in the                            entire colon, rectum and in the distal ileum for                            histology. Complications:            No immediate complications. Estimated Blood Loss:     Estimated blood loss was minimal. Impression:               - Preparation of the colon was poor.                           - Preparation of the colon was unsatisfactory.                           - Stool in the entire examined colon.                           - Erythematous mucosa  in the recto-sigmoid colon.                           - Biopsies were taken with a cold forceps for                            histology in the entire colon and in the distal                            ileum. Recommendation:           - Patient has a contact number available for                            emergencies. The signs and symptoms of potential                            delayed complications  were discussed with the                            patient. Return to normal activities tomorrow.                            Written discharge instructions were provided to the                            patient.                           - Lactose free diet and avoid artificial sweetners,                            OTC dietary supplements and he.                           - Continue present medications.                           - Await pathology results.                           - Repeat colonoscopy is recommended for                            surveillance. The colonoscopy date will be                            determined after pathology results from today's                            exam become available for review.                           - Return to GI clinic PRN. Mauri Pole, MD 11/16/2015 9:38:41 AM This report has been signed electronically.

## 2015-11-17 ENCOUNTER — Telehealth: Payer: Self-pay | Admitting: *Deleted

## 2015-11-17 NOTE — Telephone Encounter (Signed)
  Follow up Call-  Call back number 11/16/2015  Post procedure Call Back phone  # [phone]  Permission to leave phone message Yes     Patient questions:  Do you have a fever, pain , or abdominal swelling? No. Pain Score  0 *  Have you tolerated food without any problems? Yes.    Have you been able to return to your normal activities? Yes.    Do you have any questions about your discharge instructions: Diet   No. Medications  No. Follow up visit  No.  Do you have questions or concerns about your Care? No.  Actions: * If pain score is 4 or above: No action needed, pain <4.

## 2015-12-27 ENCOUNTER — Telehealth: Payer: Self-pay | Admitting: Gastroenterology

## 2015-12-27 ENCOUNTER — Other Ambulatory Visit: Payer: Self-pay

## 2015-12-27 MED ORDER — MESALAMINE 1.2 G PO TBEC: 4.8000 g | DELAYED_RELEASE_TABLET | Freq: Every day | ORAL | 0 refills | Status: DC

## 2015-12-27 MED ORDER — MESALAMINE 1000 MG RE SUPP: 1000.0000 mg | Freq: Every day | RECTAL | 0 refills | Status: DC

## 2015-12-27 NOTE — Telephone Encounter (Signed)
Discussed the pathology report with the patient. He states he has felt well and has not had symptoms lately. He stopped all dairy products and avoids artificial sweeteners. He travels a lot and is boarding a plane in 1 hour. He will be out of town for 1 week. Agrees to pick up his medication then. Agrees to call for an appointment.

## 2015-12-27 NOTE — Telephone Encounter (Signed)
-----   Message from Mauri Pole, MD sent at 11/22/2015  1:22 PM EDT ----- Matthew Holt, can you please inform him path results. 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 Please advise patient to start Lialda 4.8gm daily and Canasa suppository at bedtime x 4 weeks. Will need follow up in office visit within next 2 weeks.  [name], recall colonoscopy in 10 years. No letter Thanks

## 2017-11-11 ENCOUNTER — Telehealth: Payer: Self-pay | Admitting: Gastroenterology

## 2017-11-11 NOTE — Telephone Encounter (Signed)
Pt had colitis flare up three weeks ago. He wants to know if he can be prescribed the medication that Dr. Silverio Decamp prescribed when he was diagnosed. [facility] uses [facility] [address].

## 2017-11-12 NOTE — Telephone Encounter (Signed)
Spoke with the patient. 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 He did not follow up afterwards.  He has done well until 3 weeks ago. At that time he took a supplement which "triggered the same symptoms as before."  He was hoping to be treated empirically. He is experiencing diarrhea. He is careful with diet, avoids high fructose sweeteners, artificial sweeteners, etc.  The patient is uninsured and will be a cash pay patient. I have scheduled him to come see you on 11/14/17.

## 2017-11-13 ENCOUNTER — Other Ambulatory Visit: Payer: Self-pay

## 2017-11-13 DIAGNOSIS — R197 Diarrhea, unspecified: Secondary | ICD-10-CM

## 2017-11-13 DIAGNOSIS — R1030 Lower abdominal pain, unspecified: Secondary | ICD-10-CM

## 2017-11-13 NOTE — Telephone Encounter (Signed)
No answer. No voicemail. 

## 2017-11-13 NOTE — Telephone Encounter (Signed)
Please check C.diff, CRP and CBC and BMP. Send refill for Lialda 4.8gm daily. Not sure if he will qualify for patient assistance.

## 2017-11-13 NOTE — Telephone Encounter (Signed)
If C.diff positive, will treat it first. Please reschedule office visit for next week . Thanks

## 2017-11-13 NOTE — Telephone Encounter (Signed)
Discussed with the patient and he is willing to go with this treatment plan. He will seek medical insurance. Labs tomorrow. He will be out of town for work starting 11/21/17. Lialda has not been called to a pharmacy pending c-diff results.

## 2017-11-13 NOTE — Telephone Encounter (Signed)
Do you want him to keep the appointment?

## 2017-11-14 ENCOUNTER — Other Ambulatory Visit (INDEPENDENT_AMBULATORY_CARE_PROVIDER_SITE_OTHER): Payer: Self-pay

## 2017-11-14 ENCOUNTER — Ambulatory Visit: Payer: BLUE CROSS/BLUE SHIELD | Admitting: Gastroenterology

## 2017-11-14 DIAGNOSIS — R1030 Lower abdominal pain, unspecified: Secondary | ICD-10-CM

## 2017-11-14 DIAGNOSIS — R197 Diarrhea, unspecified: Secondary | ICD-10-CM

## 2017-11-14 LAB — CBC WITH DIFFERENTIAL/PLATELET
BASOS PCT: 0.7 % (ref 0.0–3.0)
Basophils Absolute: 0 10*3/uL (ref 0.0–0.1)
EOS PCT: 3.2 % (ref 0.0–5.0)
Eosinophils Absolute: 0.2 10*3/uL (ref 0.0–0.7)
HCT: 42.2 % (ref 39.0–52.0)
HEMOGLOBIN: 14.6 g/dL (ref 13.0–17.0)
LYMPHS ABS: 1 10*3/uL (ref 0.7–4.0)
Lymphocytes Relative: 14.7 % (ref 12.0–46.0)
MCHC: 34.5 g/dL (ref 30.0–36.0)
MCV: 88.8 fl (ref 78.0–100.0)
MONO ABS: 0.5 10*3/uL (ref 0.1–1.0)
MONOS PCT: 7.6 % (ref 3.0–12.0)
NEUTROS PCT: 73.8 % (ref 43.0–77.0)
Neutro Abs: 4.9 10*3/uL (ref 1.4–7.7)
Platelets: 203 10*3/uL (ref 150.0–400.0)
RBC: 4.75 Mil/uL (ref 4.22–5.81)
RDW: 13.2 % (ref 11.5–15.5)
WBC: 6.7 10*3/uL (ref 4.0–10.5)

## 2017-11-14 LAB — BASIC METABOLIC PANEL
BUN: 19 mg/dL (ref 6–23)
CHLORIDE: 105 meq/L (ref 96–112)
CO2: 29 meq/L (ref 19–32)
CREATININE: 1.36 mg/dL (ref 0.40–1.50)
Calcium: 9.3 mg/dL (ref 8.4–10.5)
GFR: 61.6 mL/min (ref >60.00)
GLUCOSE: 64 mg/dL — AB (ref 70–99)
POTASSIUM: 4.1 meq/L (ref 3.5–5.1)
Sodium: 140 mEq/L (ref 135–145)

## 2017-11-14 LAB — C-REACTIVE PROTEIN: CRP: 0.1 mg/dL — ABNORMAL LOW (ref 0.5–20.0)

## 2017-11-17 ENCOUNTER — Other Ambulatory Visit: Payer: Self-pay

## 2017-11-17 LAB — CLOSTRIDIUM DIFFICILE BY PCR: Toxigenic C. Difficile by PCR: NEGATIVE

## 2017-11-17 MED ORDER — MESALAMINE 1.2 G PO TBEC: 4.8000 g | DELAYED_RELEASE_TABLET | Freq: Every day | ORAL | 6 refills | Status: DC

## 2017-11-24 ENCOUNTER — Telehealth: Payer: Self-pay | Admitting: Gastroenterology

## 2017-11-24 MED ORDER — MESALAMINE 1000 MG RE SUPP: 1000.0000 mg | Freq: Every day | RECTAL | 3 refills | Status: DC

## 2017-11-24 NOTE — Telephone Encounter (Signed)
The pt is in [place] and is traveling to [place] and would like a refill on his mesalamine suppositories.  I have sent that to the [facility] in [place] per request

## 2018-12-21 ENCOUNTER — Emergency Department (HOSPITAL_COMMUNITY)
Admission: EM | Admit: 2018-12-21 | Discharge: 2018-12-21 | Disposition: A | Discharge status: Home / Self Care | Payer: Self-pay | Attending: Emergency Medicine | Admitting: Emergency Medicine

## 2018-12-21 ENCOUNTER — Emergency Department (HOSPITAL_COMMUNITY): Payer: Self-pay

## 2018-12-21 ENCOUNTER — Other Ambulatory Visit: Payer: Self-pay

## 2018-12-21 DIAGNOSIS — R42 Dizziness and giddiness: Secondary | ICD-10-CM | POA: Insufficient documentation

## 2018-12-21 DIAGNOSIS — J45909 Unspecified asthma, uncomplicated: Secondary | ICD-10-CM | POA: Insufficient documentation

## 2018-12-21 DIAGNOSIS — Z79899 Other long term (current) drug therapy: Secondary | ICD-10-CM | POA: Insufficient documentation

## 2018-12-21 DIAGNOSIS — M79602 Pain in left arm: Secondary | ICD-10-CM | POA: Insufficient documentation

## 2018-12-21 DIAGNOSIS — R079 Chest pain, unspecified: Secondary | ICD-10-CM | POA: Insufficient documentation

## 2018-12-21 DIAGNOSIS — R0602 Shortness of breath: Secondary | ICD-10-CM | POA: Insufficient documentation

## 2018-12-21 DIAGNOSIS — F17290 Nicotine dependence, other tobacco product, uncomplicated: Secondary | ICD-10-CM | POA: Insufficient documentation

## 2018-12-21 LAB — CBC
HCT: 44.9 % (ref 39.0–52.0)
Hemoglobin: 15.2 g/dL (ref 13.0–17.0)
MCH: 30.2 pg (ref 26.0–34.0)
MCHC: 33.9 g/dL (ref 30.0–36.0)
MCV: 89.3 fL (ref 80.0–100.0)
Platelets: 202 10*3/uL (ref 150–400)
RBC: 5.03 MIL/uL (ref 4.22–5.81)
RDW: 12.1 % (ref 11.5–15.5)
WBC: 5.8 10*3/uL (ref 4.0–10.5)
nRBC: 0 % (ref 0.0–0.2)

## 2018-12-21 LAB — BASIC METABOLIC PANEL
Anion gap: 9 (ref 5–15)
BUN: 12 mg/dL (ref 6–20)
CO2: 26 mmol/L (ref 22–32)
Calcium: 9.1 mg/dL (ref 8.9–10.3)
Chloride: 104 mmol/L (ref 98–111)
Creatinine, Ser: 1.17 mg/dL (ref 0.61–1.24)
GFR calc Af Amer: >60 mL/min (ref >60)
GFR calc non Af Amer: >60 mL/min (ref >60)
Glucose, Bld: 112 mg/dL — ABNORMAL HIGH (ref 70–99)
Potassium: 3.7 mmol/L (ref 3.5–5.1)
Sodium: 139 mmol/L (ref 135–145)

## 2018-12-21 LAB — D-DIMER, QUANTITATIVE: D-Dimer, Quant: 0.39 ug/mL-FEU (ref 0.00–0.50)

## 2018-12-21 LAB — TROPONIN I (HIGH SENSITIVITY)
Troponin I (High Sensitivity): 11 ng/L (ref <18)
Troponin I (High Sensitivity): 9 ng/L (ref <18)

## 2018-12-21 NOTE — ED Notes (Signed)
Pt. States that he has to leave due to wait time and having a busy schedule tomorrow. He states that he will "call in tomorrow to set up an appointment if still feeling bad".

## 2018-12-21 NOTE — ED Provider Notes (Signed)
[facility] EMERGENCY DEPARTMENT Provider Note   CSN: [id number] Arrival date & time: 12/21/18  0028     History   Chief Complaint Chief Complaint  Patient presents with  . Chest Pain    HPI Matthew Holt is a 43 y.o. male with PMHx asthma who presents to the ED complaining of sudden onset, onset, waxing and waning, heaviness to left chest radiating into left arm that began around midnight last night.  he states that he was sitting down watching TV when he began having his symptoms.  He describes the arm pain as more of a tingling sensation like his arm is falling asleep.  He also reports lightheadedness and shortness of breath.  He initially came to the ED in the middle of the night to be evaluated and had some blood work done but left prior to being seen by ED provider.  Patient states he went home and tried to sleep and when he woke up about an hour later he began having the pain again reports it was worse.  States that he has never had symptoms like this before.  He is a very active person and states he typically runs and works out without chest pain.  Patient is unsure if he has family history of CAD.  Patient occasionally smokes cigars but states he has not smoked one in about over 6 months.  He has not been taking anything for the pain.  States that he recently started working out again as the gym was opened up about a month and a half ago.  Patient also reports that he drove to [place] about 3 weeks ago.  States that he drove 8 hours to and from in a 3-day time span.  His fever, chills, leg swelling, nausea, vomiting, diaphoresis, back pain, numbness, weakness any other associated symptoms.     Past Medical History:  Diagnosis Date  . Asthma     Patient Active Problem List   Diagnosis Date Noted  . Diarrhea 11/07/2015  . AP (abdominal pain) 11/07/2015  . Rectal bleeding 11/08/2014    Past Surgical History:  Procedure Laterality Date  . ANKLE FRACTURE  SURGERY    . CLAVICLE SURGERY    . COLONOSCOPY    . WISDOM TOOTH EXTRACTION          Home Medications    Prior to Admission medications   Medication Sig Start Date End Date Taking? Authorizing Provider  Ascorbic Acid (VITAMIN C) 100 MG tablet Take 100 mg by mouth daily.    [provider]  mesalamine (CANASA) 1000 MG suppository Place 1 suppository (1,000 mg total) rectally at bedtime. 11/24/17   Mauri Pole, MD  mesalamine (LIALDA) 1.2 g EC tablet Take 4 tablets (4.8 g total) by mouth daily with breakfast for 28 days. 11/17/17 12/15/17  Mauri Pole, MD  Multiple Vitamins-Minerals (MULTIVITAMIN WITH MINERALS) tablet Take 1 tablet by mouth daily.    [provider]  Omega-3 Fatty Acids (FISH OIL) 1200 MG CAPS Take 1,200 mg by mouth.    [provider]  Probiotic Product (PROBIOTIC-10 PO) Take by mouth.    [provider]    Family History Family History  Problem Relation Age of Onset  . Prostate cancer Father   . Skin cancer Father   . Heart disease Maternal Grandfather   . Colon cancer Neg Hx   . Esophageal cancer Neg Hx   . Stomach cancer Neg Hx   . Pancreatic  cancer Neg Hx   . Liver disease Neg Hx     Social History Social History   Tobacco Use  . Smoking status: Light Tobacco Smoker    Types: Cigars  . Smokeless tobacco: Never Used  . Tobacco comment: cigars on occasion  Substance Use Topics  . Alcohol use: Yes    Alcohol/week: 5.0 - 6.0 standard drinks    Types: 2 Standard drinks or equivalent, 3 - 4 Cans of beer per week  . Drug use: No     Allergies   Patient has no known allergies.   Review of Systems Review of Systems  Constitutional: Negative for chills and fever.  HENT: Negative for congestion.   Eyes: Negative for visual disturbance.  Respiratory: Positive for shortness of breath.   Cardiovascular: Positive for chest pain. Negative for palpitations and leg swelling.  Gastrointestinal: Negative for  abdominal pain, nausea and vomiting.  Genitourinary: Negative for difficulty urinating.  Musculoskeletal: Negative for myalgias.  Skin: Negative for rash.  Neurological: Positive for light-headedness.     Physical Exam Updated Vital Signs BP (!) 124/92 (BP Location: Right Arm)   Pulse (!) 51   Temp 98 F (36.7 C) (Oral)   Resp 16   SpO2 99%   Physical Exam Vitals signs and nursing note reviewed.  Constitutional:      Appearance: He is not ill-appearing or diaphoretic.  HENT:     Head: Normocephalic and atraumatic.  Eyes:     Conjunctiva/sclera: Conjunctivae normal.  Neck:     Musculoskeletal: Neck supple.  Cardiovascular:     Rate and Rhythm: Normal rate and regular rhythm.     Pulses:          Radial pulses are 2+ on the right side and 2+ on the left side.       Dorsalis pedis pulses are 2+ on the right side and 2+ on the left side.     Heart sounds: Normal heart sounds.  Pulmonary:     Effort: Pulmonary effort is normal.     Breath sounds: Normal breath sounds. No decreased breath sounds, wheezing, rhonchi or rales.  Chest:     Chest wall: No tenderness.  Abdominal:     Palpations: Abdomen is soft.     Tenderness: There is no abdominal tenderness.  Musculoskeletal:     Right lower leg: No edema.     Left lower leg: No edema.     Comments: No C, T, L midline spinal tenderness.  Range of motion intact to left arm.  No tenderness to left shoulder, left elbow, left wrist.  Length and sensation intact throughout.  Good distal pulses.  Skin:    General: Skin is warm and dry.  Neurological:     Mental Status: He is alert.      ED Treatments / Results  Labs (all labs ordered are listed, but only abnormal results are displayed) Labs Reviewed  BASIC METABOLIC PANEL - Abnormal; Notable for the following components:      Result Value   Glucose, Bld 112 (*)    All other components within normal limits  CBC  D-DIMER, QUANTITATIVE (NOT AT [facility])  TROPONIN I (HIGH  SENSITIVITY)  TROPONIN I (HIGH SENSITIVITY)    EKG EKG Interpretation  Date/Time:  Monday December 21 2018 00:40:25 EDT Ventricular Rate:  55 PR Interval:  148 QRS Duration: 90 QT Interval:  436 QTC Calculation: 417 R Axis:   93 Text Interpretation:  Sinus bradycardia Rightward axis Borderline  ECG Confirmed by Madalyn Rob [phone]) on 12/21/2018 7:14:46 AM   Radiology Dg Chest 2 View  Result Date: 12/21/2018 CLINICAL DATA:  Initial evaluation for acute chest heaviness, shortness of breath, left upper extremity pain. EXAM: CHEST - 2 VIEW COMPARISON:  None. FINDINGS: Transverse heart size within normal limits. Mediastinal silhouette normal. Lungs mildly hyperinflated. No focal infiltrates. No pulmonary edema or visible pleural effusion. No pneumothorax. No acute osseous finding. IMPRESSION: No radiographic evidence for active cardiopulmonary disease. Electronically Signed   By: Jeannine Boga M.D.   On: 12/21/2018 01:23    Procedures Procedures (including critical care time)  Medications Ordered in ED Medications - No data to display   Initial Impression / Assessment and Plan / ED Course  I have reviewed the triage vital signs and the nursing notes.  Pertinent labs & imaging results that were available during my care of the patient were reviewed by me and considered in my medical decision making (see chart for details).    43 year old male who presents with chest pain, left arm pain, shortness of breath, lightheadedness.  He is a very active person and typically does not have chest pain with exertion.  Is currently complaining of paresthesias to his left arm.  He is no cervical tenderness to suggest radiculopathy.  he did recently return to the gym's.  Question if he is having musculoskeletal type pain.  No obvious family history.  No risk factors concerning for ACS.  Patient does smoke cigars occasionally but has not done so in over 6 months.  Initial troponin of 11 and  repeat of 9.  No leukocytosis.  No electrolyte abnormalities.  EKG without ischemic changes.  Chest x-ray clear.  Patient does endorse that he recently traveled to and from [place] by car and reports was a total of 16 hours in 3 days.  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  Cannot PERC out at this time.  Will obtain d-dimer prior to discharge.   D Dimer negative.  Patient's heart score is 1.  He is stable for discharge.  Advised to follow-up with PCP.  Given resource to find primary care physician in the area as well.  Strict return precautions discussed.  Patient is in agreement with plan at this time and stable for discharge home.   This note was prepared using Dragon voice recognition software and may include unintentional dictation errors due to the inherent limitations of voice recognition software.   Final Clinical Impressions(s) / ED Diagnoses   Final diagnoses:  Nonspecific chest pain    ED Discharge Orders    None       Eustaquio Maize, PA-C 12/21/18 1504    Lucrezia Starch, MD 12/22/18 [phone]

## 2018-12-21 NOTE — ED Notes (Signed)
Patient verbalizes understanding of discharge instructions . Opportunity for questions and answers were provided . Armband removed by staff ,Pt discharged from ED. W/C  offered at D/C  and Declined W/C at D/C and was escorted to lobby by RN.  

## 2018-12-21 NOTE — Discharge Instructions (Signed)
You were seen in the ED today for chest pain and left arm pain.  Your lab work was very reassuring today.  We checked an EKG that looks at the rhythm of your heart.  This did not show anything concerning.  Your chest x-ray did not show any signs of infection today.  We checked an enzyme that your heart releases its injured and both of these levels were normal.  We also checked a blood level that looks for blood clots in your body given you recently travel to [place].  This was also normal.   Please follow-up with your primary care physician regarding her chest pain.  If you do not have 1 you may call the number on your discharge paperwork to find a physician in the area that takes your insurance.  Return to the ED immediately for any worsening chest pain, vomiting, shortness of breath, if you pass out.

## 2018-12-21 NOTE — ED Triage Notes (Signed)
Pt states was watching television and began having left arm pain, then light headedness, heaviness in chest with SOB.

## 2018-12-21 NOTE — ED Notes (Signed)
Pt. Has returned after pain and numbness in extremity started again.

## 2019-06-03 ENCOUNTER — Encounter: Payer: Self-pay | Admitting: Family Medicine

## 2019-06-03 ENCOUNTER — Ambulatory Visit (INDEPENDENT_AMBULATORY_CARE_PROVIDER_SITE_OTHER): Payer: 59 | Admitting: Family Medicine

## 2019-06-03 ENCOUNTER — Other Ambulatory Visit: Payer: Self-pay

## 2019-06-03 VITALS — BP 118/86 | HR 56 | Temp 98.2°F | Ht 75.0 in | Wt 229.2 lb

## 2019-06-03 DIAGNOSIS — R739 Hyperglycemia, unspecified: Secondary | ICD-10-CM

## 2019-06-03 DIAGNOSIS — K529 Noninfective gastroenteritis and colitis, unspecified: Secondary | ICD-10-CM

## 2019-06-03 DIAGNOSIS — Z125 Encounter for screening for malignant neoplasm of prostate: Secondary | ICD-10-CM

## 2019-06-03 DIAGNOSIS — Z13220 Encounter for screening for lipoid disorders: Secondary | ICD-10-CM | POA: Diagnosis not present

## 2019-06-03 DIAGNOSIS — Z0001 Encounter for general adult medical examination with abnormal findings: Secondary | ICD-10-CM | POA: Diagnosis not present

## 2019-06-03 DIAGNOSIS — K51911 Ulcerative colitis, unspecified with rectal bleeding: Secondary | ICD-10-CM | POA: Insufficient documentation

## 2019-06-03 LAB — LIPID PANEL
Cholesterol: 204 mg/dL — ABNORMAL HIGH (ref 0–200)
HDL: 65 mg/dL (ref >39.00)
LDL Cholesterol: 126 mg/dL — ABNORMAL HIGH (ref 0–99)
NonHDL: 138.71
Total CHOL/HDL Ratio: 3
Triglycerides: 65 mg/dL (ref 0.0–149.0)
VLDL: 13 mg/dL (ref 0.0–40.0)

## 2019-06-03 LAB — COMPREHENSIVE METABOLIC PANEL
ALT: 35 U/L (ref 0–53)
AST: 35 U/L (ref 0–37)
Albumin: 4.6 g/dL (ref 3.5–5.2)
Alkaline Phosphatase: 85 U/L (ref 39–117)
BUN: 19 mg/dL (ref 6–23)
CO2: 30 mEq/L (ref 19–32)
Calcium: 9.8 mg/dL (ref 8.4–10.5)
Chloride: 104 mEq/L (ref 96–112)
Creatinine, Ser: 1.1 mg/dL (ref 0.40–1.50)
GFR: 73.47 mL/min (ref >60.00)
Glucose, Bld: 87 mg/dL (ref 70–99)
Potassium: 4.6 mEq/L (ref 3.5–5.1)
Sodium: 139 mEq/L (ref 135–145)
Total Bilirubin: 0.9 mg/dL (ref 0.2–1.2)
Total Protein: 7.5 g/dL (ref 6.0–8.3)

## 2019-06-03 LAB — HEMOGLOBIN A1C: Hgb A1c MFr Bld: 5.8 % (ref 4.6–6.5)

## 2019-06-03 LAB — CBC
HCT: 46.1 % (ref 39.0–52.0)
Hemoglobin: 15.6 g/dL (ref 13.0–17.0)
MCHC: 33.9 g/dL (ref 30.0–36.0)
MCV: 89.8 fl (ref 78.0–100.0)
Platelets: 230 10*3/uL (ref 150.0–400.0)
RBC: 5.13 Mil/uL (ref 4.22–5.81)
RDW: 13 % (ref 11.5–15.5)
WBC: 6.4 10*3/uL (ref 4.0–10.5)

## 2019-06-03 LAB — TSH: TSH: 2.95 u[IU]/mL (ref 0.35–4.50)

## 2019-06-03 LAB — PSA: PSA: 1.39 ng/mL (ref 0.10–4.00)

## 2019-06-03 NOTE — Progress Notes (Signed)
Chief Complaint:  Matthew Holt is a 44 y.o. male who presents today for his annual comprehensive physical exam.    Assessment/Plan:  Chronic Problems Addressed Today: Colitis Stable.  Continue management per GI.  Check CBC, C met, TSH today.  Hyperglycemia Check A1c.  Knee Pain Likely slight OA.  No red flags.  Recommended neoprene compression sleeves, ice, and over-the-counter Voltaren as needed.  Diminished Urinary Stream Likely mild BPH.  Will check PSA today.  Patient does not want start medications.  Preventative Healthcare: Check CBC, C met, TSH, PSA, lipid panel.  Patient Counseling(The following topics were reviewed and/or handout was given):  -Nutrition: Stressed importance of moderation in sodium/caffeine intake, saturated fat and cholesterol, caloric balance, sufficient intake of fresh fruits, vegetables, and fiber.  -Stressed the importance of regular exercise.   -Substance Abuse: Discussed cessation/primary prevention of tobacco, alcohol, or other drug use; driving or other dangerous activities under the influence; availability of treatment for abuse.   -Injury prevention: Discussed safety belts, safety helmets, smoke detector, smoking near bedding or upholstery.   -Sexuality: Discussed sexually transmitted diseases, partner selection, use of condoms, avoidance of unintended pregnancy and contraceptive alternatives.   -Dental health: Discussed importance of regular tooth brushing, flossing, and dental visits.  -Health maintenance and immunizations reviewed. Please refer to Health maintenance section.  Return to care in 1 year for next preventative visit.     Subjective:  HPI:  He has no acute complaints today.   Lifestyle Diet: Eats out at restaurants frequently.  Exercise: Staying active with weight lifting and playing sports. Likes to run.   Depression screen PHQ 2/9 06/03/2019  Decreased Interest 0  Down, Depressed, Hopeless 0  PHQ - 2 Score 0     Health Maintenance Due  Topic Date Due  . HIV Screening  07/30/1992     ROS: Per HPI, otherwise a complete review of systems was negative.   PMH:  The following were reviewed and entered/updated in epic: Past Medical History:  Diagnosis Date  . Asthma   . Colitis    Patient Active Problem List   Diagnosis Date Noted  . Colitis    Past Surgical History:  Procedure Laterality Date  . ANKLE FRACTURE SURGERY    . CLAVICLE SURGERY    . COLONOSCOPY    . WISDOM TOOTH EXTRACTION      Family History  Problem Relation Age of Onset  . Prostate cancer Father   . Skin cancer Father   . Heart disease Maternal Grandfather   . Colon cancer Neg Hx   . Esophageal cancer Neg Hx   . Stomach cancer Neg Hx   . Pancreatic cancer Neg Hx   . Liver disease Neg Hx     Medications- reviewed and updated Current Outpatient Medications  Medication Sig Dispense Refill  . Multiple Vitamins-Minerals (MULTIVITAMIN WITH MINERALS) tablet Take 1 tablet by mouth daily.     No current facility-administered medications for this visit.    Allergies-reviewed and updated No Known Allergies  Social History   Socioeconomic History  . Marital status: Married    Spouse name: Not on file  . Number of children: Not on file  . Years of education: Not on file  . Highest education level: Not on file  Occupational History  . Not on file  Tobacco Use  . Smoking status: Light Tobacco Smoker    Types: Cigars  . Smokeless tobacco: Never Used  . Tobacco comment: cigars on occasion  Substance  and Sexual Activity  . Alcohol use: Yes    Alcohol/week: 5.0 - 6.0 standard drinks    Types: 3 - 4 Cans of beer, 2 Standard drinks or equivalent per week  . Drug use: No  . Sexual activity: Not on file  Other Topics Concern  . Not on file  Social History Narrative  . Not on file   Social Determinants of Health   Financial Resource Strain:   . Difficulty of Paying Living Expenses: Not on file  Food  Insecurity:   . Worried About Charity fundraiser in the Last Year: Not on file  . Ran Out of Food in the Last Year: Not on file  Transportation Needs:   . Lack of Transportation (Medical): Not on file  . Lack of Transportation (Non-Medical): Not on file  Physical Activity:   . Days of Exercise per Week: Not on file  . Minutes of Exercise per Session: Not on file  Stress:   . Feeling of Stress : Not on file  Social Connections:   . Frequency of Communication with Friends and Family: Not on file  . Frequency of Social Gatherings with Friends and Family: Not on file  . Attends Religious Services: Not on file  . Active Member of Clubs or Organizations: Not on file  . Attends Archivist Meetings: Not on file  . Marital Status: Not on file        Objective:  Physical Exam: BP 118/86   Pulse (!) 56   Temp 98.2 F (36.8 C)   Ht '6\' 3"'  (1.905 m)   Wt 229 lb 4 oz (104 kg)   SpO2 99%   BMI 28.65 kg/m   Body mass index is 28.65 kg/m. Wt Readings from Last 3 Encounters:  06/03/19 229 lb 4 oz (104 kg)  11/16/15 244 lb (110.7 kg)  11/07/15 233 lb 14.4 oz (106.1 kg)   Gen: NAD, resting comfortably HEENT: TMs normal bilaterally. OP clear. No thyromegaly noted.  CV: RRR with no murmurs appreciated Pulm: NWOB, CTAB with no crackles, wheezes, or rhonchi GI: Normal bowel sounds present. Soft, Nontender, Nondistended. MSK: no edema, cyanosis, or clubbing noted Skin: warm, dry Neuro: CN2-12 grossly intact. Strength 5/5 in upper and lower extremities. Reflexes symmetric and intact bilaterally.  Psych: Normal affect and thought content     Pallie Swigert M. Jerline Pain, MD 06/03/2019 11:02 AM

## 2019-06-03 NOTE — Patient Instructions (Signed)
It was very nice to see you today!  We will check blood work today.  Please come back in 1 year for your next physical, or sooner if needed.  Take care, Dr Jerline Pain  Please try these tips to maintain a healthy lifestyle:   Eat at least 3 REAL meals and 1-2 snacks per day.  Aim for no more than 5 hours between eating.  If you eat breakfast, please do so within one hour of getting up.    Each meal should contain half fruits/vegetables, one quarter protein, and one quarter carbs (no bigger than a computer mouse)   Cut down on sweet beverages. This includes juice, soda, and sweet tea.     Drink at least 1 glass of water with each meal and aim for at least 8 glasses per day   Exercise at least 150 minutes every week.    Preventive Care 42-42 Years Old, Male Preventive care refers to lifestyle choices and visits with your health care provider that can promote health and wellness. This includes:  A yearly physical exam. This is also called an annual well check.  Regular dental and eye exams.  Immunizations.  Screening for certain conditions.  Healthy lifestyle choices, such as eating a healthy diet, getting regular exercise, not using drugs or products that contain nicotine and tobacco, and limiting alcohol use. What can I expect for my preventive care visit? Physical exam Your health care provider will check:  Height and weight. These may be used to calculate body mass index (BMI), which is a measurement that tells if you are at a healthy weight.  Heart rate and blood pressure.  Your skin for abnormal spots. Counseling Your health care provider may ask you questions about:  Alcohol, tobacco, and drug use.  Emotional well-being.  Home and relationship well-being.  Sexual activity.  Eating habits.  Work and work Statistician. What immunizations do I need?  Influenza (flu) vaccine  This is recommended every year. Tetanus, diphtheria, and pertussis (Tdap)  vaccine  You may need a Td booster every 10 years. Varicella (chickenpox) vaccine  You may need this vaccine if you have not already been vaccinated. Zoster (shingles) vaccine  You may need this after age 42. Measles, mumps, and rubella (MMR) vaccine  You may need at least one dose of MMR if you were born in 1957 or later. You may also need a second dose. Pneumococcal conjugate (PCV13) vaccine  You may need this if you have certain conditions and were not previously vaccinated. Pneumococcal polysaccharide (PPSV23) vaccine  You may need one or two doses if you smoke cigarettes or if you have certain conditions. Meningococcal conjugate (MenACWY) vaccine  You may need this if you have certain conditions. Hepatitis A vaccine  You may need this if you have certain conditions or if you travel or work in places where you may be exposed to hepatitis A. Hepatitis B vaccine  You may need this if you have certain conditions or if you travel or work in places where you may be exposed to hepatitis B. Haemophilus influenzae type b (Hib) vaccine  You may need this if you have certain risk factors. Human papillomavirus (HPV) vaccine  If recommended by your health care provider, you may need three doses over 6 months. You may receive vaccines as individual doses or as more than one vaccine together in one shot (combination vaccines). Talk with your health care provider about the risks and benefits of combination vaccines. What tests do  I need? Blood tests  Lipid and cholesterol levels. These may be checked every 5 years, or more frequently if you are over 25 years old.  Hepatitis C test.  Hepatitis B test. Screening  Lung cancer screening. You may have this screening every year starting at age 61 if you have a 30-pack-year history of smoking and currently smoke or have quit within the past 15 years.  Prostate cancer screening. Recommendations will vary depending on your family history  and other risks.  Colorectal cancer screening. All adults should have this screening starting at age 21 and continuing until age 68. Your health care provider may recommend screening at age 32 if you are at increased risk. You will have tests every 1-10 years, depending on your results and the type of screening test.  Diabetes screening. This is done by checking your blood sugar (glucose) after you have not eaten for a while (fasting). You may have this done every 1-3 years.  Sexually transmitted disease (STD) testing. Follow these instructions at home: Eating and drinking  Eat a diet that includes fresh fruits and vegetables, whole grains, lean protein, and low-fat dairy products.  Take vitamin and mineral supplements as recommended by your health care provider.  Do not drink alcohol if your health care provider tells you not to drink.  If you drink alcohol: ? Limit how much you have to 0-2 drinks a day. ? Be aware of how much alcohol is in your drink. In the [place] one drink equals one 12 oz bottle of beer (355 mL), one 5 oz glass of wine (148 mL), or one 1 oz glass of hard liquor (44 mL). Lifestyle  Take daily care of your teeth and gums.  Stay active. Exercise for at least 30 minutes on 5 or more days each week.  Do not use any products that contain nicotine or tobacco, such as cigarettes, e-cigarettes, and chewing tobacco. If you need help quitting, ask your health care provider.  If you are sexually active, practice safe sex. Use a condom or other form of protection to prevent STIs (sexually transmitted infections).  Talk with your health care provider about taking a low-dose aspirin every day starting at age 65. What's next?  Go to your health care provider once a year for a well check visit.  Ask your health care provider how often you should have your eyes and teeth checked.  Stay up to date on all vaccines. This information is not intended to replace advice given to you  by your health care provider. Make sure you discuss any questions you have with your health care provider. Document Revised: 04/02/2018 Document Reviewed: 04/02/2018 Elsevier Patient Education  2020 [employer].

## 2019-06-03 NOTE — Assessment & Plan Note (Signed)
Stable.  Continue management per GI.  Check CBC, C met, TSH today.

## 2019-06-04 NOTE — Progress Notes (Signed)
Please inform patient of the following:  Cholesterol and blood sugar levels are slightly elevated, but everything else is NORMAL. Would like for him to continue working on diet and exercise and we can recheck in a year or so.  Matthew Holt. Jerline Pain, MD 06/04/2019 8:07 AM

## 2019-06-24 ENCOUNTER — Telehealth: Payer: Self-pay | Admitting: Family Medicine

## 2019-06-24 NOTE — Telephone Encounter (Signed)
Patient called in this afternoon and wanted to advise Dr.parker that he now has covid.

## 2019-06-25 NOTE — Telephone Encounter (Signed)
FYI

## 2019-06-28 ENCOUNTER — Ambulatory Visit (INDEPENDENT_AMBULATORY_CARE_PROVIDER_SITE_OTHER): Payer: 59 | Admitting: Family Medicine

## 2019-06-28 ENCOUNTER — Encounter: Payer: Self-pay | Admitting: Family Medicine

## 2019-06-28 DIAGNOSIS — U071 COVID-19: Secondary | ICD-10-CM | POA: Diagnosis not present

## 2019-06-28 DIAGNOSIS — J189 Pneumonia, unspecified organism: Secondary | ICD-10-CM

## 2019-06-28 MED ORDER — AZITHROMYCIN 250 MG PO TABS: ORAL_TABLET | ORAL | 0 refills | Status: DC

## 2019-06-28 MED ORDER — PREDNISONE 50 MG PO TABS: ORAL_TABLET | ORAL | 0 refills | Status: DC

## 2019-06-28 MED ORDER — BENZONATATE 200 MG PO CAPS: 200.0000 mg | ORAL_CAPSULE | Freq: Two times a day (BID) | ORAL | 0 refills | Status: DC | PRN

## 2019-06-28 NOTE — Progress Notes (Signed)
   Matthew Holt is a 44 y.o. male who presents today for a virtual office visit.  Assessment/Plan:  New/Acute Problems: COVID / Pneumonia Concern for bacterial superinfection given that symptoms were improving then worsened within the last couple of days.  Currently has normal work of breathing with no red flags however does have signs of systemic illness including fevers.  Unable to get chest x-ray due to Covid precautions.  Will empirically treat for bacterial superinfection with azithromycin and prednisone.  Also start Tessalon to help with his symptoms.  Discussed reasons to return to care or seek emergent care.      Subjective:  HPI:  Patient was diagnosed with Covid 9 days ago.  Initially had symptoms including cough, body aches, and chills.  Symptoms improved for the first several days however the last for 5 days symptoms have worsened.  He has had progressively worsening cough and congestion.  More fevers chills, and body aches.  Has tried taking over-the-counter medications such as Robitussin with modest improvement.  No chest pain.  No current shortness of breath.       Objective/Observations  Physical Exam: Gen: NAD, resting comfortably Pulm: Normal work of breathing Neuro: Grossly normal, moves all extremities Psych: Normal affect and thought content  Virtual Visit via Video   I connected with [name] on 06/28/19 at 11:40 AM EST by a video enabled telemedicine application and verified that I am speaking with the correct person using two identifiers. The limitations of evaluation and management by telemedicine and the availability of in person appointments were discussed. The patient expressed understanding and agreed to proceed.   Patient location: Home Provider location: [place] participating in the virtual visit: Myself and Patient     Algis Greenhouse. Jerline Pain, MD 06/28/2019 10:50 AM

## 2019-06-29 ENCOUNTER — Ambulatory Visit: Payer: 59 | Admitting: Family Medicine

## 2019-06-30 ENCOUNTER — Emergency Department (HOSPITAL_COMMUNITY)
Admission: EM | Admit: 2019-06-30 | Discharge: 2019-06-30 | Disposition: A | Discharge status: Home / Self Care | Payer: 59 | Attending: Emergency Medicine | Admitting: Emergency Medicine

## 2019-06-30 ENCOUNTER — Encounter: Payer: Self-pay | Admitting: Physician Assistant

## 2019-06-30 ENCOUNTER — Other Ambulatory Visit: Payer: Self-pay

## 2019-06-30 ENCOUNTER — Ambulatory Visit (INDEPENDENT_AMBULATORY_CARE_PROVIDER_SITE_OTHER): Payer: 59 | Admitting: Physician Assistant

## 2019-06-30 ENCOUNTER — Emergency Department (HOSPITAL_COMMUNITY): Payer: 59

## 2019-06-30 DIAGNOSIS — U071 COVID-19: Secondary | ICD-10-CM | POA: Diagnosis not present

## 2019-06-30 DIAGNOSIS — R509 Fever, unspecified: Secondary | ICD-10-CM | POA: Diagnosis not present

## 2019-06-30 DIAGNOSIS — R05 Cough: Secondary | ICD-10-CM | POA: Diagnosis not present

## 2019-06-30 DIAGNOSIS — R0789 Other chest pain: Secondary | ICD-10-CM | POA: Diagnosis not present

## 2019-06-30 DIAGNOSIS — Z720 Tobacco use: Secondary | ICD-10-CM | POA: Diagnosis not present

## 2019-06-30 DIAGNOSIS — J189 Pneumonia, unspecified organism: Secondary | ICD-10-CM | POA: Diagnosis not present

## 2019-06-30 DIAGNOSIS — R0602 Shortness of breath: Secondary | ICD-10-CM | POA: Diagnosis present

## 2019-06-30 LAB — TROPONIN I (HIGH SENSITIVITY)
Troponin I (High Sensitivity): 8 ng/L (ref <18)
Troponin I (High Sensitivity): 11 ng/L

## 2019-06-30 LAB — CBC WITH DIFFERENTIAL/PLATELET
Abs Immature Granulocytes: 0.08 10*3/uL — ABNORMAL HIGH (ref 0.00–0.07)
Basophils Absolute: 0 10*3/uL (ref 0.0–0.1)
Basophils Relative: 0 %
Eosinophils Absolute: 0 10*3/uL (ref 0.0–0.5)
Eosinophils Relative: 0 %
HCT: 45.5 % (ref 39.0–52.0)
Hemoglobin: 15.5 g/dL (ref 13.0–17.0)
Immature Granulocytes: 1 %
Lymphocytes Relative: 6 %
Lymphs Abs: 0.5 10*3/uL — ABNORMAL LOW (ref 0.7–4.0)
MCH: 30 pg (ref 26.0–34.0)
MCHC: 34.1 g/dL (ref 30.0–36.0)
MCV: 88.2 fL (ref 80.0–100.0)
Monocytes Absolute: 0.8 10*3/uL (ref 0.1–1.0)
Monocytes Relative: 8 %
Neutro Abs: 7.9 10*3/uL — ABNORMAL HIGH (ref 1.7–7.7)
Neutrophils Relative %: 85 %
Platelets: 241 10*3/uL (ref 150–400)
RBC: 5.16 MIL/uL (ref 4.22–5.81)
RDW: 11.9 % (ref 11.5–15.5)
WBC: 9.3 10*3/uL (ref 4.0–10.5)
nRBC: 0 % (ref 0.0–0.2)

## 2019-06-30 LAB — COMPREHENSIVE METABOLIC PANEL
ALT: 53 U/L — ABNORMAL HIGH (ref 0–44)
AST: 62 U/L — ABNORMAL HIGH (ref 15–41)
Albumin: 3.2 g/dL — ABNORMAL LOW (ref 3.5–5.0)
Alkaline Phosphatase: 108 U/L (ref 38–126)
Anion gap: 10 (ref 5–15)
BUN: 10 mg/dL (ref 6–20)
CO2: 25 mmol/L (ref 22–32)
Calcium: 9 mg/dL (ref 8.9–10.3)
Chloride: 102 mmol/L (ref 98–111)
Creatinine, Ser: 1.22 mg/dL (ref 0.61–1.24)
GFR calc Af Amer: >60 mL/min (ref >60)
GFR calc non Af Amer: >60 mL/min (ref >60)
Glucose, Bld: 122 mg/dL — ABNORMAL HIGH (ref 70–99)
Potassium: 4.2 mmol/L (ref 3.5–5.1)
Sodium: 137 mmol/L (ref 135–145)
Total Bilirubin: 0.7 mg/dL (ref 0.3–1.2)
Total Protein: 7.1 g/dL (ref 6.5–8.1)

## 2019-06-30 MED ORDER — ALBUTEROL SULFATE HFA 108 (90 BASE) MCG/ACT IN AERS
4.0000 | INHALATION_SPRAY | Freq: Once | RESPIRATORY_TRACT | Status: AC
  Administered 2019-06-30: 12:00:00 4 via RESPIRATORY_TRACT
  Filled 2019-06-30: qty 6.7

## 2019-06-30 MED ORDER — AEROCHAMBER PLUS FLO-VU MEDIUM MISC
1.0000 | Freq: Once | Status: AC
  Administered 2019-06-30: 12:00:00 1

## 2019-06-30 NOTE — Progress Notes (Signed)
Virtual Visit via Video   I connected with Matthew Holt on 06/30/19 at  8:30 AM EST by a video enabled telemedicine application and verified that I am speaking with the correct person using two identifiers. Location patient: Home Location provider: [facility], Office Persons participating in the virtual visit: Matthew Holt, [name] PA-C, Matthew Pickler, Matthew Holt   I discussed the limitations of evaluation and management by telemedicine and the availability of in person appointments. The patient expressed understanding and agreed to proceed.  I acted as a Education administrator for [employer], PA-C Matthew Pickler, Matthew Holt  Subjective:   HPI:   Symptom onset: Started on Sunday Feb 28th, tested positive for COVID-19 on Monday March 1st.  Patient was seen by his PCP on 06/28/2019 for evaluation of COVID-19 and pneumonia via virtual visit.  Patient was started on azithromycin, prednisone, and benzonatate.  He states that since he started this regimen he has not felt improvement.  He states that he had a rough night last night, and that his temperature got up to 102.8.  He also is experiencing intermittent chest pain, but denies wheezing.  He got up to use the bathroom last night and had sudden onset shortness of breath and dizziness, that resulted in him losing balance and hitting his head.  He did not lose consciousness.   Patient risk factors: Current [id number] risk of complications score: 1 Smoking status: Matthew Holt  reports that he has been smoking cigars. He has never used smokeless tobacco. If male, currently pregnant? []   Yes []   No  ROS: See pertinent positives and negatives per HPI.  Patient Active Problem List   Diagnosis Date Noted  . Colitis     Social History   Tobacco Use  . Smoking status: Light Tobacco Smoker    Types: Cigars  . Smokeless tobacco: Never Used  . Tobacco comment: cigars on occasion  Substance Use Topics  . Alcohol use: Yes    Alcohol/week: 5.0 -  6.0 standard drinks    Types: 3 - 4 Cans of beer, 2 Standard drinks or equivalent per week    Current Outpatient Medications:  .  azithromycin (ZITHROMAX) 250 MG tablet, Take 2 tabs day 1, then 1 tab daily, Disp: 6 each, Rfl: 0 .  benzonatate (TESSALON) 200 MG capsule, Take 1 capsule (200 mg total) by mouth 2 (two) times daily as needed for cough., Disp: 20 capsule, Rfl: 0 .  Multiple Vitamins-Minerals (MULTIVITAMIN WITH MINERALS) tablet, Take 1 tablet by mouth daily., Disp: , Rfl:  .  predniSONE (DELTASONE) 50 MG tablet, Take 1 tablet daily for 5 days., Disp: 5 tablet, Rfl: 0  No Known Allergies  Objective:   VITALS: Per patient if applicable, see vitals. GENERAL: Alert, appears well and in no acute distress. HEENT: Atraumatic, conjunctiva clear, no obvious abnormalities on inspection of external nose and ears. NECK: Normal movements of the head and neck. CARDIOPULMONARY: No increased WOB. Speaking in clear sentences. I:E ratio WNL.  MS: Moves all visible extremities without noticeable abnormality. PSYCH: Pleasant and cooperative, well-groomed. Speech normal rate and rhythm. Affect is appropriate. Insight and judgement are appropriate. Attention is focused, linear, and appropriate.  NEURO: CN grossly intact. Oriented as arrived to appointment on time with no prompting. Moves both UE equally.  SKIN: No obvious lesions, wounds, erythema, or cyanosis noted on face or hands.  Assessment and Plan:   Matthew Holt was seen today for covid symptoms.  Diagnoses and all orders for this  visit:  COVID-19  Pneumonia due to infectious organism, unspecified laterality, unspecified part of lung   Due to lack of improvement with outpatient management, recommend further evaluation in the ER immediately.  Patient verbalized understanding to plan and was in agreement to go.  . Reviewed expectations re: course of current medical issues. . Discussed self-management of symptoms. . Outlined signs and  symptoms indicating need for more acute intervention. . Patient verbalized understanding and all questions were answered. Marland Kitchen Health Maintenance issues including appropriate healthy diet, exercise, and smoking avoidance were discussed with patient. . See orders for this visit as documented in the electronic medical record.  I discussed the assessment and treatment plan with the patient. The patient was provided an opportunity to ask questions and all were answered. The patient agreed with the plan and demonstrated an understanding of the instructions.   The patient was advised to call back or seek an in-person evaluation if the symptoms worsen or if the condition fails to improve as anticipated.   CMA or Matthew Holt served as scribe during this visit. History, Physical, and Plan performed by medical provider. The above documentation has been reviewed and is accurate and complete.   [place], [place] 06/30/2019

## 2019-06-30 NOTE — ED Triage Notes (Signed)
Came in POV; c/o shortness of breathe and chest pain that comes and go in nature. Stated no significant medical problems. Patient endorsed day 11 of Covid + and symptoms are worsening.

## 2019-06-30 NOTE — ED Provider Notes (Signed)
[facility] EMERGENCY DEPARTMENT Provider Note   CSN: [id number] Arrival date & time: 06/30/19  [id number]     History Chief Complaint  Patient presents with  . Shortness of Breath    Matthew Holt is a 44 y.o. male presenting for evaluation of shortness of breath.  Patient states he first developed Covid symptoms on Feb 28, proximally 11 days ago.  He tested positive on March 1.  Symptoms worsened last weekend, he had a video visit with his primary care doctor who was concerned about superimposed bacterial infection and patient was started on prednisone, Tessalon, and azithromycin 2 days ago.  Patient states last night his symptoms worsened again.  He reports worsening shortness of breath and cough.  He reports fevers up to 102/103 last night.  He reported a central/left-sided chest burning and discomfort last night.  Shortness of breath is worse with exertion and talking, minimal at rest.  He denies chest pain currently.  He denies nausea, vomiting, abdominal pain, urinary symptoms, abnormal bowel movements.  He has no other medical problems, takes medications daily.  He has been taking the prednisone and azithromycin as prescribed without improvement.  HPI     Past Medical History:  Diagnosis Date  . Asthma   . Colitis     Patient Active Problem List   Diagnosis Date Noted  . Colitis     Past Surgical History:  Procedure Laterality Date  . ANKLE FRACTURE SURGERY    . CLAVICLE SURGERY    . COLONOSCOPY    . WISDOM TOOTH EXTRACTION         Family History  Problem Relation Age of Onset  . Prostate cancer Father   . Skin cancer Father   . Heart disease Maternal Grandfather   . Colon cancer Neg Hx   . Esophageal cancer Neg Hx   . Stomach cancer Neg Hx   . Pancreatic cancer Neg Hx   . Liver disease Neg Hx     Social History   Tobacco Use  . Smoking status: Light Tobacco Smoker    Types: Cigars  . Smokeless tobacco: Never Used  . Tobacco comment:  cigars on occasion  Substance Use Topics  . Alcohol use: Yes    Alcohol/week: 5.0 - 6.0 standard drinks    Types: 3 - 4 Cans of beer, 2 Standard drinks or equivalent per week  . Drug use: No    Home Medications Prior to Admission medications   Medication Sig Start Date End Date Taking? Authorizing Provider  acetaminophen (TYLENOL) 500 MG tablet Take 1,000 mg by mouth every 6 (six) hours as needed for mild pain or fever.   Yes [provider]  azithromycin (ZITHROMAX) 250 MG tablet Take 2 tabs day 1, then 1 tab daily 06/28/19  Yes Vivi Barrack, MD  benzonatate (TESSALON) 200 MG capsule Take 1 capsule (200 mg total) by mouth 2 (two) times daily as needed for cough. 06/28/19  Yes Vivi Barrack, MD  ibuprofen (ADVIL) 200 MG tablet Take 600 mg by mouth every 6 (six) hours as needed for fever or moderate pain.   Yes [provider]  Multiple Vitamins-Minerals (MULTIVITAMIN WITH MINERALS) tablet Take 1 tablet by mouth daily.   Yes [provider]  Phenyleph-CPM-DM-APAP (ALKA-SELTZER PLUS COLD & FLU PO) Take 2 capsules by mouth every 6 (six) hours as needed (flu symptoms).   Yes [provider]  Phenyleph-Diphenhyd-DM-APAP [facility] SEVERE COLD & COUGH) Packet MISC Take 1 packet  by mouth every 6 (six) hours as needed (flu symptoms).   Yes [provider]  predniSONE (DELTASONE) 50 MG tablet Take 1 tablet daily for 5 days. 06/28/19  Yes Vivi Barrack, MD    Allergies    Patient has no known allergies.  Review of Systems   Review of Systems  Constitutional: Positive for fever.  Respiratory: Positive for cough and shortness of breath.   Cardiovascular: Positive for chest pain.  All other systems reviewed and are negative.   Physical Exam Updated Vital Signs BP 124/86   Pulse 62   Temp 97.9 F (36.6 C) (Oral)   Resp 20   Ht 6\' 3"  (1.905 m)   Wt 102.1 kg   SpO2 95%   BMI 28.12 kg/m   Physical Exam Vitals and nursing note reviewed.    Constitutional:      General: He is not in acute distress.    Appearance: He is well-developed. He is ill-appearing.     Comments: Appears ill, but no acute distress  HENT:     Head: Normocephalic and atraumatic.  Eyes:     Conjunctiva/sclera: Conjunctivae normal.     Pupils: Pupils are equal, round, and reactive to light.  Cardiovascular:     Rate and Rhythm: Normal rate and regular rhythm.     Pulses: Normal pulses.  Pulmonary:     Effort: Pulmonary effort is normal. No respiratory distress.     Breath sounds: Rales present. No wheezing.     Comments: Patient becomes mildly tachypneic with talking, RR up to 30.  Mild expiratory rails, mostly in the left side.  Intermittent cough.  Speaking full sentences.  Sats upper 90s on room air.  With ambulation, patient also becomes tachypneic around 30, sats stay in the mid 90s. Abdominal:     General: There is no distension.     Palpations: Abdomen is soft. There is no mass.     Tenderness: There is no abdominal tenderness. There is no guarding or rebound.  Musculoskeletal:        General: Normal range of motion.     Cervical back: Normal range of motion and neck supple.     Right lower leg: No edema.     Left lower leg: No edema.     Comments: No leg pain or swelling  Skin:    General: Skin is warm and dry.     Capillary Refill: Capillary refill takes less than 2 seconds.  Neurological:     Mental Status: He is alert and oriented to person, place, and time.     ED Results / Procedures / Treatments   Labs (all labs ordered are listed, but only abnormal results are displayed) Labs Reviewed  CBC WITH DIFFERENTIAL/PLATELET - Abnormal; Notable for the following components:      Result Value   Neutro Abs 7.9 (*)    Lymphs Abs 0.5 (*)    Abs Immature Granulocytes 0.08 (*)    All other components within normal limits  COMPREHENSIVE METABOLIC PANEL - Abnormal; Notable for the following components:   Glucose, Bld 122 (*)    Albumin  3.2 (*)    AST 62 (*)    ALT 53 (*)    All other components within normal limits  TROPONIN I (HIGH SENSITIVITY)  TROPONIN I (HIGH SENSITIVITY)    EKG EKG Interpretation  Date/Time:  Wednesday June 30 2019 10:02:46 EST Ventricular Rate:  62 PR Interval:    QRS Duration: 98 QT  Interval:  408 QTC Calculation: 415 R Axis:   95 Text Interpretation: Sinus rhythm Consider right ventricular hypertrophy Baseline wander in lead(s) II III aVF V1 since last tracing no significant change Confirmed by Noemi Chapel [phone]) on 06/30/2019 10:27:14 AM   Radiology DG Chest Portable 1 View  Result Date: 06/30/2019 CLINICAL DATA:  Positive COVID 10 days ago, concern for pneumonia EXAM: PORTABLE CHEST 1 VIEW COMPARISON:  12/21/2018 FINDINGS: Mild cardiomegaly. Subtle, diffuse bilateral heterogeneous airspace opacity. The visualized skeletal structures are unremarkable. IMPRESSION: Mild cardiomegaly with subtle, diffuse bilateral heterogeneous airspace opacity, in keeping with reported COVID diagnosis. Electronically Signed   By: Eddie Candle M.D.   On: 06/30/2019 10:31    Procedures Procedures (including critical care time)  Medications Ordered in ED Medications  albuterol (VENTOLIN HFA) 108 (90 Base) MCG/ACT inhaler 4 puff (4 puffs Inhalation Given 06/30/19 1149)  AeroChamber Plus Flo-Vu Medium MISC 1 each (1 each Other Given 06/30/19 1149)    ED Course  I have reviewed the triage vital signs and the nursing notes.  Pertinent labs & imaging results that were available during my care of the patient were reviewed by me and considered in my medical decision making (see chart for details).    MDM Rules/Calculators/A&P                      Patient presenting for evaluation of worsening shortness of breath, continued fevers, and cough in the setting of a Covid diagnosis.  On exam, patient is nontoxic.  He is ambulatory without desaturation.  No signs of respiratory distress.  Mild rales on  exhalation.  Will obtain x-ray and basic labs.  I expect patient will likely be able to be discharged with symptomatic treatment as he is fairly well-appearing on exam.  Labs interpreted by me, overall reassuring.  Hemoglobin stable.  Electrolytes stable.  No leukocytosis.  X-ray viewed interpreted by me, shows mild bilateral patchy infiltrates consistent with Covid pneumonia.  He remains nontoxic and without hypoxia in the ED.  On reassessment after albuterol, patient reports mild improvement.  I discussed continued symptomatic treatment at home and close monitoring of symptoms.  At this time, patient appears safe for discharge.  Return precautions given.  Patient states he understands and agrees to plan.  Final Clinical Impression(s) / ED Diagnoses Final diagnoses:  [id number]    Rx / DC Orders ED Discharge Orders    None       Franchot Heidelberg, PA-C 06/30/19 1505    Noemi Chapel, MD 07/01/19 2246

## 2019-06-30 NOTE — Discharge Instructions (Addendum)
Continue taking antibiotics and prednisone as prescribed. Use the albuterol inhaler every 2-4 hours when awake for the next 2 days.  After this, use as needed for shortness of breath, chest tightness, wheezing, or coughing fits. Continue to treat symptomatically, use Tylenol or ibuprofen as needed for fever or pain. Make sure to stay well-hydrated with water. Return to the emergency room if you develop severe worsening chest pain, increased difficulty breathing, or any new, worsening, concerning symptoms.

## 2019-06-30 NOTE — ED Notes (Signed)
Pt ambulated to the room with a steady gait. Pt did complain of some ShOB. O2 saturation was at 94%. EDP, [place], aware.

## 2019-06-30 NOTE — ED Provider Notes (Signed)
This patient is a 44 year old male, states that he is otherwise healthy however for the last 11 days he has been suffering with a coronavirus illness mostly respiratory and has presented with several days of worsening coughing shortness of breath and some chest discomfort which is started last night.  He has had some recurrent fevers, on exam he was 96% after ambulating all the way through the department back to his room.  He is speaking in full sentences but does have some subtle rales with deep breathing.  There is no edema, no tachycardia, his oxygen is 98% on room air when resting and his heart rate is 57, EKG is unremarkable.  I have personally viewed his chest x-ray and find what appears to be mild multifocal pneumonia, there does not appear to be any overwhelming burden of infection and without a leukocytosis of fever or tachycardia here it seems unlikely that this would be bacterial.  He has already had Zithromax  Anticipate steroids, bronchodilator therapy, discharge home, the patient has been given reassurance.  Trop pending to r/o myocarditis  Medical screening examination/treatment/procedure(s) were conducted as a shared visit with non-physician practitioner(s) and myself.  I personally evaluated the patient during the encounter.  Clinical Impression:   Final diagnoses:  [id number]      EKG Interpretation  Date/Time:  Wednesday June 30 2019 10:02:46 EST Ventricular Rate:  62 PR Interval:    QRS Duration: 98 QT Interval:  408 QTC Calculation: 415 R Axis:   95 Text Interpretation: Sinus rhythm Consider right ventricular hypertrophy Baseline wander in lead(s) II III aVF V1 since last tracing no significant change Confirmed by Noemi Chapel [phone]) on 06/30/2019 10:27:14 AM          Noemi Chapel, MD 07/01/19 2246

## 2019-07-05 ENCOUNTER — Ambulatory Visit (INDEPENDENT_AMBULATORY_CARE_PROVIDER_SITE_OTHER): Payer: 59 | Admitting: Family Medicine

## 2019-07-05 ENCOUNTER — Encounter: Payer: Self-pay | Admitting: Family Medicine

## 2019-07-05 VITALS — Ht 75.0 in | Wt 218.0 lb

## 2019-07-05 DIAGNOSIS — R21 Rash and other nonspecific skin eruption: Secondary | ICD-10-CM | POA: Diagnosis not present

## 2019-07-05 DIAGNOSIS — U071 COVID-19: Secondary | ICD-10-CM

## 2019-07-05 NOTE — Progress Notes (Signed)
   Matthew Holt is a 44 y.o. male who presents today for a virtual office visit.  Assessment/Plan:  New/Acute Problems: Rash Likely allergic reaction to azithromycin. No current red flags.  We will continue with supportive management including topical hydrocortisone and Benadryl.  Symptoms seem to be improving. Discussed warning signs and reasons to return to care.  Will need referral to allergy if has recurrence.   COVID Still has ongoing fatigue, malaise, and headache.  Discussed typical course of illness and the fact that this should improve over the next several weeks.  Encouraged him toh work on respiratory exercises and be as active as possible.  May need repeat lung imaging and/or referral to pulmonology if symptoms do not gradually improve over the next several weeks.     Subjective:  HPI:  Patient was seen a week ago with Covid.  There was concern for bacterial pneumonia and was started on prednisone and azithromycin.  Unfortunately symptoms continue to worsen and he was seen in emergency room 5 days ago.  Had x-ray which showed pneumonia.  He was discharged home to continue symptom management.  He finished his antibiotics and prednisone 3 days ago.  The next day started developing a rash on face and neck.  He has had quite a bit of itchiness.  Is used Benadryl and hydrocortisone which is helped.  Symptoms are stable.  Not worsening.  Had some issues with throat scratchiness and felt like it was closing up a couple of days ago but no issues since.  Still having ongoing issues with Covid recovery including some shortness of breath, fatigue, and headaches.        Objective/Observations  Physical Exam: Gen: NAD, resting comfortably Pulm: Normal work of breathing Neuro: Grossly normal, moves all extremities Psych: Normal affect and thought content  Virtual Visit via Video   I connected with Matthew Holt on 07/05/19 at 11:20 AM EDT by a video enabled telemedicine application and  verified that I am speaking with the correct person using two identifiers. The limitations of evaluation and management by telemedicine and the availability of in person appointments were discussed. The patient expressed understanding and agreed to proceed.   Patient location: Home Provider location: [place] participating in the virtual visit: Myself and Patient     Algis Greenhouse. Jerline Pain, MD 07/05/2019 11:41 AM

## 2019-07-13 ENCOUNTER — Telehealth: Payer: Self-pay

## 2019-07-13 NOTE — Telephone Encounter (Signed)
Please advise 

## 2019-07-13 NOTE — Telephone Encounter (Signed)
Ok for him to get vaccine as long as he has recovered from illness.  Algis Greenhouse. Jerline Pain, MD 07/13/2019 3:53 PM

## 2019-07-13 NOTE — Telephone Encounter (Signed)
Patient wife calling because her husband tested positive for covid about 3-4 weeks ago. Patient would like to know could he still received the the covid vaccine this coming week. Please follow up with wife [phone]

## 2019-07-14 NOTE — Telephone Encounter (Signed)
Left detailed message regarding  questions Per DPR.Informed patient to call clinic if any questions or concerns.

## 2020-09-05 ENCOUNTER — Ambulatory Visit (INDEPENDENT_AMBULATORY_CARE_PROVIDER_SITE_OTHER): Payer: 59 | Admitting: Family Medicine

## 2020-09-05 ENCOUNTER — Other Ambulatory Visit: Payer: Self-pay

## 2020-09-05 ENCOUNTER — Encounter: Payer: Self-pay | Admitting: Family Medicine

## 2020-09-05 VITALS — BP 108/70 | HR 69 | Temp 97.4°F | Wt 227.8 lb

## 2020-09-05 DIAGNOSIS — R739 Hyperglycemia, unspecified: Secondary | ICD-10-CM | POA: Diagnosis not present

## 2020-09-05 DIAGNOSIS — Z125 Encounter for screening for malignant neoplasm of prostate: Secondary | ICD-10-CM | POA: Diagnosis not present

## 2020-09-05 DIAGNOSIS — E785 Hyperlipidemia, unspecified: Secondary | ICD-10-CM

## 2020-09-05 DIAGNOSIS — M79602 Pain in left arm: Secondary | ICD-10-CM | POA: Diagnosis not present

## 2020-09-05 DIAGNOSIS — M7661 Achilles tendinitis, right leg: Secondary | ICD-10-CM

## 2020-09-05 DIAGNOSIS — Z0001 Encounter for general adult medical examination with abnormal findings: Secondary | ICD-10-CM

## 2020-09-05 HISTORY — DX: Hyperlipidemia, unspecified: E78.5

## 2020-09-05 LAB — CBC
HCT: 45.8 % (ref 39.0–52.0)
Hemoglobin: 15.8 g/dL (ref 13.0–17.0)
MCHC: 34.5 g/dL (ref 30.0–36.0)
MCV: 89.8 fl (ref 78.0–100.0)
Platelets: 244 10*3/uL (ref 150.0–400.0)
RBC: 5.1 Mil/uL (ref 4.22–5.81)
RDW: 12.8 % (ref 11.5–15.5)
WBC: 4.7 10*3/uL (ref 4.0–10.5)

## 2020-09-05 LAB — COMPREHENSIVE METABOLIC PANEL
ALT: 37 U/L (ref 0–53)
AST: 36 U/L (ref 0–37)
Albumin: 4.5 g/dL (ref 3.5–5.2)
Alkaline Phosphatase: 102 U/L (ref 39–117)
BUN: 13 mg/dL (ref 6–23)
CO2: 31 mEq/L (ref 19–32)
Calcium: 9.8 mg/dL (ref 8.4–10.5)
Chloride: 103 mEq/L (ref 96–112)
Creatinine, Ser: 1 mg/dL (ref 0.40–1.50)
GFR: 92.45 mL/min (ref >60.00)
Glucose, Bld: 93 mg/dL (ref 70–99)
Potassium: 4.5 mEq/L (ref 3.5–5.1)
Sodium: 140 mEq/L (ref 135–145)
Total Bilirubin: 0.8 mg/dL (ref 0.2–1.2)
Total Protein: 7.4 g/dL (ref 6.0–8.3)

## 2020-09-05 LAB — LIPID PANEL
Cholesterol: 184 mg/dL (ref 0–200)
HDL: 55.8 mg/dL (ref >39.00)
LDL Cholesterol: 113 mg/dL — ABNORMAL HIGH (ref 0–99)
NonHDL: 127.88
Total CHOL/HDL Ratio: 3
Triglycerides: 75 mg/dL (ref 0.0–149.0)
VLDL: 15 mg/dL (ref 0.0–40.0)

## 2020-09-05 LAB — HEMOGLOBIN A1C: Hgb A1c MFr Bld: 5.6 % (ref 4.6–6.5)

## 2020-09-05 LAB — PSA: PSA: 1.6 ng/mL (ref 0.10–4.00)

## 2020-09-05 LAB — TSH: TSH: 3.17 u[IU]/mL (ref 0.35–4.50)

## 2020-09-05 NOTE — Assessment & Plan Note (Signed)
Check labs 

## 2020-09-05 NOTE — Patient Instructions (Signed)
It was very nice to see you today!  Please work on the exercises for your Achilles and arm.  We will check blood work today.  We will check a CT scan of your heart.  I will see back in year.  Come back to see me sooner if needed.  Take care, Dr Jerline Pain  PLEASE NOTE:  If you had any lab tests please let us know if you have not heard back within a few days. You may see your results on mychart before we have a chance to review them but we will give you a call once they are reviewed by [place]. If we ordered any referrals today, please let us know if you have not heard from their office within the next week.   Please try these tips to maintain a healthy lifestyle:   Eat at least 3 REAL meals and 1-2 snacks per day.  Aim for no more than 5 hours between eating.  If you eat breakfast, please do so within one hour of getting up.    Each meal should contain half fruits/vegetables, one quarter protein, and one quarter carbs (no bigger than a computer mouse)   Cut down on sweet beverages. This includes juice, soda, and sweet tea.     Drink at least 1 glass of water with each meal and aim for at least 8 glasses per day   Exercise at least 150 minutes every week.    Preventive Care 30-32 Years Old, Male Preventive care refers to lifestyle choices and visits with your health care provider that can promote health and wellness. This includes:  A yearly physical exam. This is also called an annual wellness visit.  Regular dental and eye exams.  Immunizations.  Screening for certain conditions.  Healthy lifestyle choices, such as: ? Eating a healthy diet. ? Getting regular exercise. ? Not using drugs or products that contain nicotine and tobacco. ? Limiting alcohol use. What can I expect for my preventive care visit? Physical exam Your health care provider will check your:  Height and weight. These may be used to calculate your BMI (body mass index). BMI is a measurement that tells if  you are at a healthy weight.  Heart rate and blood pressure.  Body temperature.  Skin for abnormal spots. Counseling Your health care provider may ask you questions about your:  Past medical problems.  Family's medical history.  Alcohol, tobacco, and drug use.  Emotional well-being.  Home life and relationship well-being.  Sexual activity.  Diet, exercise, and sleep habits.  Work and work Statistician.  Access to firearms. What immunizations do I need? Vaccines are usually given at various ages, according to a schedule. Your health care provider will recommend vaccines for you based on your age, medical history, and lifestyle or other factors, such as travel or where you work.   What tests do I need? Blood tests  Lipid and cholesterol levels. These may be checked every 5 years, or more often if you are over 28 years old.  Hepatitis C test.  Hepatitis B test. Screening  Lung cancer screening. You may have this screening every year starting at age 80 if you have a 30-pack-year history of smoking and currently smoke or have quit within the past 15 years.  Prostate cancer screening. Recommendations will vary depending on your family history and other risks.  Genital exam to check for testicular cancer or hernias.  Colorectal cancer screening. ? All adults should have this screening starting  at age 59 and continuing until age 35. ? Your health care provider may recommend screening at age 45 if you are at increased risk. ? You will have tests every 1-10 years, depending on your results and the type of screening test.  Diabetes screening. ? This is done by checking your blood sugar (glucose) after you have not eaten for a while (fasting). ? You may have this done every 1-3 years.  STD (sexually transmitted disease) testing, if you are at risk. Follow these instructions at home: Eating and drinking  Eat a diet that includes fresh fruits and vegetables, whole grains,  lean protein, and low-fat dairy products.  Take vitamin and mineral supplements as recommended by your health care provider.  Do not drink alcohol if your health care provider tells you not to drink.  If you drink alcohol: ? Limit how much you have to 0-2 drinks a day. ? Be aware of how much alcohol is in your drink. In the [place] one drink equals one 12 oz bottle of beer (355 mL), one 5 oz glass of wine (148 mL), or one 1 oz glass of hard liquor (44 mL).   Lifestyle  Take daily care of your teeth and gums. Brush your teeth every morning and night with fluoride toothpaste. Floss one time each day.  Stay active. Exercise for at least 30 minutes 5 or more days each week.  Do not use any products that contain nicotine or tobacco, such as cigarettes, e-cigarettes, and chewing tobacco. If you need help quitting, ask your health care provider.  Do not use drugs.  If you are sexually active, practice safe sex. Use a condom or other form of protection to prevent STIs (sexually transmitted infections).  If told by your health care provider, take low-dose aspirin daily starting at age 40.  Find healthy ways to cope with stress, such as: ? Meditation, yoga, or listening to music. ? Journaling. ? Talking to a trusted person. ? Spending time with friends and family. Safety  Always wear your seat belt while driving or riding in a vehicle.  Do not drive: ? If you have been drinking alcohol. Do not ride with someone who has been drinking. ? When you are tired or distracted. ? While texting.  Wear a helmet and other protective equipment during sports activities.  If you have firearms in your house, make sure you follow all gun safety procedures. What's next?  Go to your health care provider once a year for an annual wellness visit.  Ask your health care provider how often you should have your eyes and teeth checked.  Stay up to date on all vaccines. This information is not intended to  replace advice given to you by your health care provider. Make sure you discuss any questions you have with your health care provider. Document Revised: 01/05/2019 Document Reviewed: 04/02/2018 Elsevier Patient Education  2021 [employer].

## 2020-09-05 NOTE — Progress Notes (Signed)
Chief Complaint:  Matthew Holt is a 45 y.o. male who presents today for his annual comprehensive physical exam.    Assessment/Plan:  New/Acute Problems: Left Arm Pain Exam consistent with biceps tendinitis.  Discussed home exercise program and handout was given.  He can continue ice.  Can use over-the-counter meds as needed.  Achilles pain Mild Achilles tendinitis.  Discussed importance of good footwear.  Recommended heel cups/lift.  Discussed home exercises and handout was given.  Can use over-the-counter meds as needed.  Chronic Problems Addressed Today: Dyslipidemia Check labs.  Preventative Healthcare: Check labs today.  Discussed cardiac CT scan.  Will order today.  Due for colon cancer screening in 5 years.  Had colonoscopy in 2017.  Patient Counseling(The following topics were reviewed and/or handout was given):  -Nutrition: Stressed importance of moderation in sodium/caffeine intake, saturated fat and cholesterol, caloric balance, sufficient intake of fresh fruits, vegetables, and fiber.  -Stressed the importance of regular exercise.   -Substance Abuse: Discussed cessation/primary prevention of tobacco, alcohol, or other drug use; driving or other dangerous activities under the influence; availability of treatment for abuse.   -Injury prevention: Discussed safety belts, safety helmets, smoke detector, smoking near bedding or upholstery.   -Sexuality: Discussed sexually transmitted diseases, partner selection, use of condoms, avoidance of unintended pregnancy and contraceptive alternatives.   -Dental health: Discussed importance of regular tooth brushing, flossing, and dental visits.  -Health maintenance and immunizations reviewed. Please refer to Health maintenance section.  Return to care in 1 year for next preventative visit.     Subjective:  HPI:  He has no acute complaints today.  He has been having intermittent left arm pain.  Comes and goes.  Located on medial  aspect of his left biceps.  No specific treatments tried.  Is also had some issues with right Achilles pain.  Worse after running.  Lifestyle Diet: Vegan diet.  Exercise: Likes playing basketball and running.   Depression screen PHQ 2/9 09/05/2020  Decreased Interest 0  Down, Depressed, Hopeless 0  PHQ - 2 Score 0  Altered sleeping -  Tired, decreased energy -  Change in appetite -  Feeling bad or failure about yourself  -  Trouble concentrating -  Moving slowly or fidgety/restless -  Suicidal thoughts -  PHQ-9 Score -  Difficult doing work/chores -    Health Maintenance Due  Topic Date Due  . HIV Screening  Never done  . Hepatitis C Screening  Never done  . TETANUS/TDAP  Never done     ROS: Per HPI, otherwise a complete review of systems was negative.   PMH:  The following were reviewed and entered/updated in epic: Past Medical History:  Diagnosis Date  . Asthma   . Colitis    Patient Active Problem List   Diagnosis Date Noted  . Dyslipidemia 09/05/2020   Past Surgical History:  Procedure Laterality Date  . ANKLE FRACTURE SURGERY    . CLAVICLE SURGERY    . COLONOSCOPY    . WISDOM TOOTH EXTRACTION      Family History  Problem Relation Age of Onset  . Prostate cancer Father   . Skin cancer Father   . Heart disease Maternal Grandfather   . Colon cancer Neg Hx   . Esophageal cancer Neg Hx   . Stomach cancer Neg Hx   . Pancreatic cancer Neg Hx   . Liver disease Neg Hx     Medications- reviewed and updated Current Outpatient Medications  Medication Sig  Dispense Refill  . acetaminophen (TYLENOL) 500 MG tablet Take 1,000 mg by mouth every 6 (six) hours as needed for mild pain or fever.    Marland Kitchen ibuprofen (ADVIL) 200 MG tablet Take 600 mg by mouth every 6 (six) hours as needed for fever or moderate pain.    . Multiple Vitamins-Minerals (MULTIVITAMIN WITH MINERALS) tablet Take 1 tablet by mouth daily.     No current facility-administered medications for this  visit.    Allergies-reviewed and updated Allergies  Allergen Reactions  . Azithromycin Rash    Social History   Socioeconomic History  . Marital status: Married    Spouse name: Not on file  . Number of children: Not on file  . Years of education: Not on file  . Highest education level: Not on file  Occupational History  . Not on file  Tobacco Use  . Smoking status: Light Tobacco Smoker    Types: Cigars  . Smokeless tobacco: Never Used  . Tobacco comment: cigars on occasion  Vaping Use  . Vaping Use: Never used  Substance and Sexual Activity  . Alcohol use: Yes    Alcohol/week: 5.0 - 6.0 standard drinks    Types: 3 - 4 Cans of beer, 2 Standard drinks or equivalent per week  . Drug use: No  . Sexual activity: Not on file  Other Topics Concern  . Not on file  Social History Narrative  . Not on file   Social Determinants of Health   Financial Resource Strain: Not on file  Food Insecurity: Not on file  Transportation Needs: Not on file  Physical Activity: Not on file  Stress: Not on file  Social Connections: Not on file        Objective:  Physical Exam: BP 108/70   Pulse 69   Temp (!) 97.4 F (36.3 C) (Temporal)   Wt 227 lb 12.8 oz (103.3 kg)   SpO2 98%   BMI 28.47 kg/m   Body mass index is 28.47 kg/m. Wt Readings from Last 3 Encounters:  09/05/20 227 lb 12.8 oz (103.3 kg)  07/05/19 218 lb (98.9 kg)  06/30/19 225 lb (102.1 kg)   Gen: NAD, resting comfortably HEENT: TMs normal bilaterally. OP clear. No thyromegaly noted.  CV: RRR with no murmurs appreciated Pulm: NWOB, CTAB with no crackles, wheezes, or rhonchi GI: Normal bowel sounds present. Soft, Nontender, Nondistended. MSK:  -Left arm: Tender palpation along the biceps tendon.  Mild pain with resisted supination.  Neurovascular intact distally - Right leg: Tenderness to palpation along distal Achilles. Skin: warm, dry Neuro: CN2-12 grossly intact. Strength 5/5 in upper and lower extremities.  Reflexes symmetric and intact bilaterally.  Psych: Normal affect and thought content     Jahred Tatar M. Jerline Pain, MD 09/05/2020 9:16 AM

## 2020-09-07 NOTE — Progress Notes (Signed)
Please inform patient of the following:  Labs are all stable.  Better than last year.  Would like for him to keep up the good work and we can recheck in year.

## 2020-10-13 ENCOUNTER — Other Ambulatory Visit: Payer: Self-pay

## 2020-10-13 ENCOUNTER — Encounter (INDEPENDENT_AMBULATORY_CARE_PROVIDER_SITE_OTHER): Payer: Self-pay

## 2020-10-13 ENCOUNTER — Ambulatory Visit (INDEPENDENT_AMBULATORY_CARE_PROVIDER_SITE_OTHER)
Admission: RE | Admit: 2020-10-13 | Discharge: 2020-10-13 | Disposition: A | Discharge status: Home / Self Care | Payer: Self-pay | Source: Ambulatory Visit | Attending: Family Medicine | Admitting: Family Medicine

## 2020-10-13 DIAGNOSIS — E785 Hyperlipidemia, unspecified: Secondary | ICD-10-CM

## 2020-10-13 NOTE — Progress Notes (Signed)
Please inform patient of the following:  Good news! His cardiac CT scan showed no signs of calcification. Would like for him to keep up the good work and we can recheck in 5-10 years.  Matthew Holt. Jerline Pain, MD 10/13/2020 2:26 PM

## 2020-11-26 IMAGING — DX DG CHEST 1V PORT
2 series · 2 of 2 positions shown · non-contrast
Comparison: 12/21/2018

CLINICAL DATA: Positive COVID 10 days ago, concern for pneumonia

EXAM:
PORTABLE CHEST 1 VIEW

[chest ap (1 of 2)]
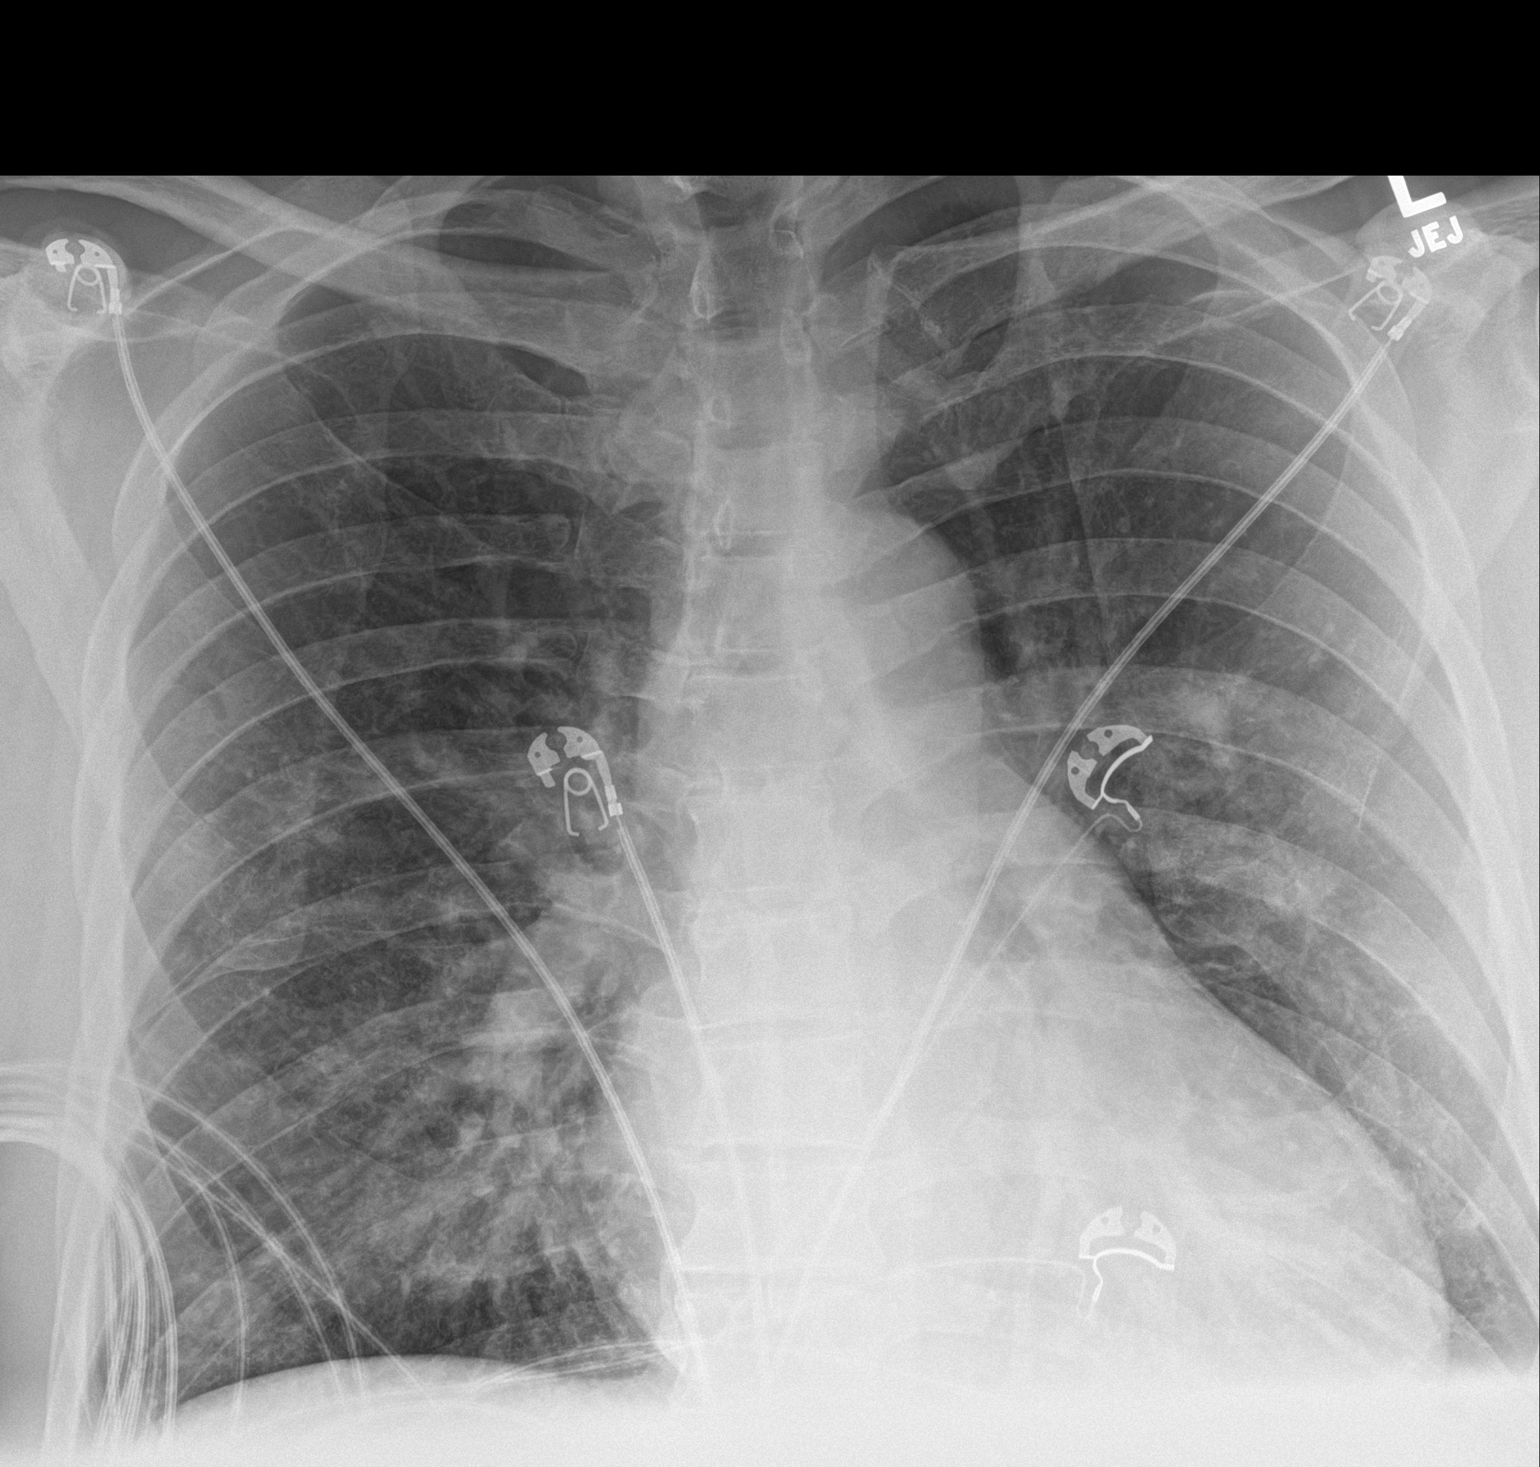

[chest ap (2 of 2)]
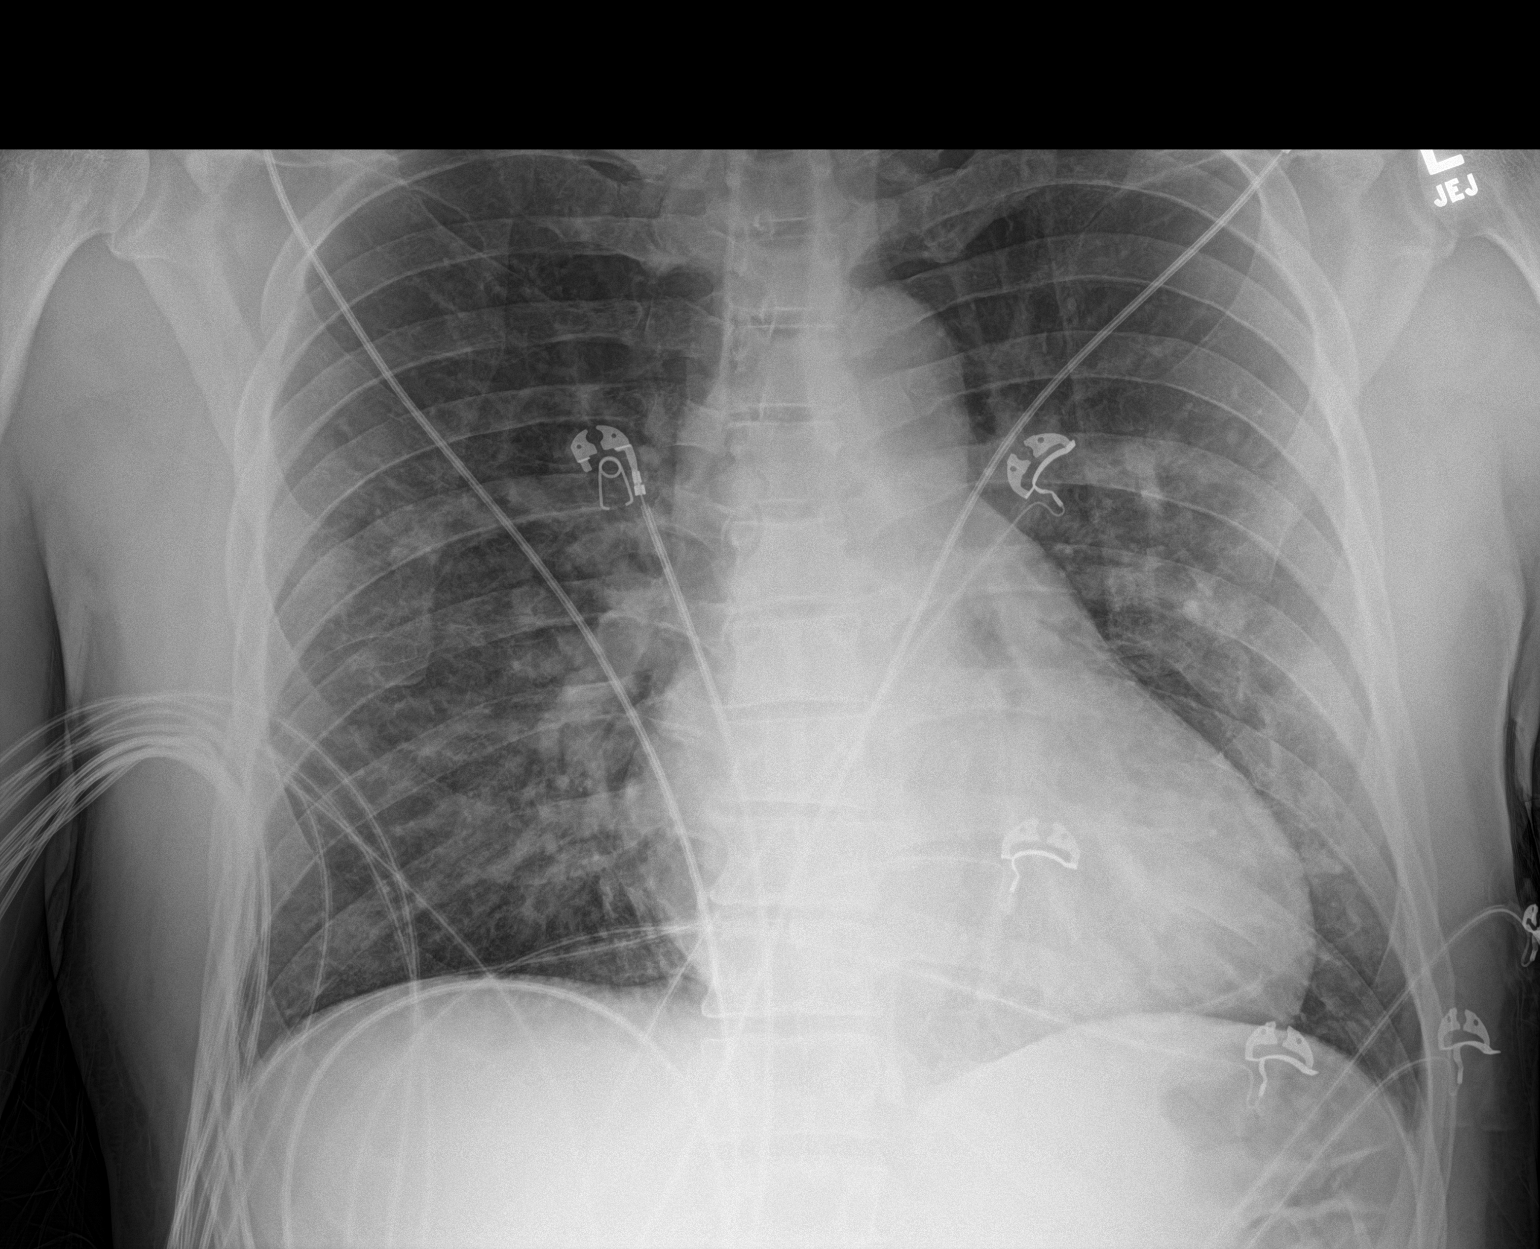

[2 of 2 positions shown; findings below may reference images not displayed]

FINDINGS: Mild cardiomegaly. Subtle, diffuse bilateral heterogeneous airspace
opacity. The visualized skeletal structures are unremarkable.
IMPRESSION: Mild cardiomegaly with subtle, diffuse bilateral heterogeneous
airspace opacity, in keeping with reported COVID diagnosis.

## 2021-04-27 ENCOUNTER — Encounter: Payer: Self-pay | Admitting: Nurse Practitioner

## 2021-04-27 ENCOUNTER — Telehealth (INDEPENDENT_AMBULATORY_CARE_PROVIDER_SITE_OTHER): Payer: 59 | Admitting: Nurse Practitioner

## 2021-04-27 ENCOUNTER — Other Ambulatory Visit: Payer: Self-pay

## 2021-04-27 DIAGNOSIS — U071 COVID-19: Secondary | ICD-10-CM | POA: Diagnosis not present

## 2021-04-27 MED ORDER — ALBUTEROL SULFATE HFA 108 (90 BASE) MCG/ACT IN AERS: 2.0000 | INHALATION_SPRAY | Freq: Four times a day (QID) | RESPIRATORY_TRACT | 0 refills | Status: DC | PRN

## 2021-04-27 NOTE — Progress Notes (Signed)
An audio/visual tele-health visit was completed today for this patient. I connected with  Matthew Holt on 04/27/21 utilizing audio/visual technology and verified that I am speaking with the correct person using two identifiers. The patient was located at their home, and I was located at the office of [place] at [facility] during the encounter. I discussed the limitations of evaluation and management by telemedicine. The patient expressed understanding and agreed to proceed.    Subjective:  Patient ID: Matthew Holt, male    DOB: Apr 24, 1975  Age: 46 y.o. MRN: [id number]  CC:  Chief Complaint  Patient presents with   Covid Positive      HPI  This patient arrives today for virtual visit for the above.  Symptoms initially started approximately 7 days ago.  He tested negative about 6 days ago, but then 4 days ago took another test and was positive for COVID-19 infection.  Currently he reports his symptoms as mild and he is mainly experiencing myalgias, headache, dry cough.  He does experience some mild shortness of breath with exertion.  He was wondering if there is anything he can take to hasten the course of the disease so that he can get better sooner and come out of quarantine.  He has been taking severe cold and flu and that seems to really help his symptoms.  Past Medical History:  Diagnosis Date   Asthma    Colitis       Family History  Problem Relation Age of Onset   Prostate cancer Father    Skin cancer Father    Heart disease Maternal Grandfather    Colon cancer Neg Hx    Esophageal cancer Neg Hx    Stomach cancer Neg Hx    Pancreatic cancer Neg Hx    Liver disease Neg Hx     Social History   Social History Narrative   Not on file   Social History   Tobacco Use   Smoking status: Light Smoker    Types: Cigars   Smokeless tobacco: Never   Tobacco comments:    cigars on occasion  Substance Use Topics   Alcohol use: Yes    Alcohol/week:  5.0 - 6.0 standard drinks    Types: 3 - 4 Cans of beer, 2 Standard drinks or equivalent per week     Current Meds  Medication Sig   acetaminophen (TYLENOL) 500 MG tablet Take 1,000 mg by mouth every 6 (six) hours as needed for mild pain or fever.   ibuprofen (ADVIL) 200 MG tablet Take 600 mg by mouth every 6 (six) hours as needed for fever or moderate pain.   Multiple Vitamins-Minerals (MULTIVITAMIN WITH MINERALS) tablet Take 1 tablet by mouth daily.    ROS:  Review of Systems  HENT:  Positive for congestion and sore throat.   Respiratory:  Positive for cough. Negative for sputum production, shortness of breath and wheezing.   Musculoskeletal:  Positive for joint pain and myalgias.  Neurological:  Positive for headaches.    Objective:   Today's Vitals: There were no vitals taken for this visit. Vitals with BMI 04/27/2021 09/05/2020 07/05/2019  Height - - 6\' 3"   Weight - 227 lbs 13 oz 218 lbs  BMI - - 10.17  Systolic (No Data) 510 -  Diastolic (No Data) 70 -  Pulse - 69 -     Physical Exam Comprehensive physical exam not completed today as office visit was conducted remotely.  Patient  appeared fairly well over video.  He did not appear to be in any acute distress, no respiratory distress noted. patient was alert and oriented, and appeared to have appropriate judgment.       Assessment and Plan   1. COVID-19      Plan: 1.  He is unfortunately outside the window of being able to take antiviral medication.  We did discuss symptomatic treatment and I recommend he keep taking his severe cold and flu, he can take Tylenol and or ibuprofen in addition to this as long as he make sure that he does not take over 3000 mg of Tylenol in 24 hours from all sources.  He was advised to check dosage of Tylenol or acetaminophen in his sinus cold and flu medication.  He tells me he understands.  Because he is still symptomatic he would like to continue quarantining and I recommended he continue  quarantining until day 10 of symptom onset unless he starts to feel better before then.  We also discussed wearing a mask if he decides to come out of quarantine until day 10 of symptom onset.  Will prescribe albuterol inhaler that he can take as needed for shortness of breath or wheezing.  We did discuss possibly taking a steroid but for now he would like to hold off on this because symptoms are mild.  We did discuss that if symptoms worsen over the weekend especially if he experiences severe shortness of breath needs to go to the hospital he tells me he understands.  He was also encouraged to call our office if symptoms progress or do not improve next week.   Tests ordered No orders of the defined types were placed in this encounter.     No orders of the defined types were placed in this encounter.   Patient to follow-up as scheduled or sooner as needed.  Total time spent on the video today was approximately 15 minutes.  Ailene Ards, NP

## 2021-05-03 ENCOUNTER — Ambulatory Visit: Payer: 59 | Admitting: Nurse Practitioner

## 2021-05-04 ENCOUNTER — Encounter: Payer: Self-pay | Admitting: Gastroenterology

## 2021-05-04 ENCOUNTER — Ambulatory Visit: Payer: 59 | Admitting: Gastroenterology

## 2021-05-04 VITALS — BP 132/98 | HR 56 | Ht 75.0 in | Wt 230.8 lb

## 2021-05-04 DIAGNOSIS — R1013 Epigastric pain: Secondary | ICD-10-CM

## 2021-05-04 DIAGNOSIS — K625 Hemorrhage of anus and rectum: Secondary | ICD-10-CM

## 2021-05-04 DIAGNOSIS — K51911 Ulcerative colitis, unspecified with rectal bleeding: Secondary | ICD-10-CM | POA: Diagnosis not present

## 2021-05-04 MED ORDER — MESALAMINE 1.2 G PO TBEC: 4.8000 g | DELAYED_RELEASE_TABLET | Freq: Every day | ORAL | 2 refills | Status: DC

## 2021-05-04 MED ORDER — MESALAMINE 1000 MG RE SUPP: 1000.0000 mg | Freq: Every day | RECTAL | 2 refills | Status: DC

## 2021-05-04 MED ORDER — NA SULFATE-K SULFATE-MG SULF 17.5-3.13-1.6 GM/177ML PO SOLN: ORAL | 0 refills | Status: DC

## 2021-05-04 NOTE — Progress Notes (Signed)
05/04/2021 Matthew Holt [id number] February 27, 1976   HISTORY OF PRESENT ILLNESS: This is a 46 year old male who is a patient of Dr. Woodward Ku.  He has left-sided colitis/proctosigmoiditis.  Was last seen here in 2017 at the time of his colonoscopy.  He was started on Lialda 4.8 g daily and Canasa suppositories.  Never followed up after that.  He had contact with [place] in 2019 requesting refills on his medications and we did send at least the Canasa suppositories at that time.  He really has not been on any of his medications since that time, however.  He comes in today stating that he was doing really well for a while, but lately he has been seeing blood in his stool again.  More frequent stools, but not diarrhea, they are formed for the most part.  He would like to restart his medications and have another colonoscopy.  He also reports that he had COVID for the third time he was taking a bunch of medication to treat his symptoms and he developed epigastric abdominal pain.  He said that after taking some reflux medication and discontinuing those other cold and flu medications that his abdominal pain has completely resolved.  He is requesting to have an EGD performed, however.  He had extensive basic labs performed in May 2022 and CBC, CMP, TSH all look great at that time.  Colonoscopy 10/2015:  - Preparation of the colon was poor. - Preparation of the colon was unsatisfactory. - Stool in the entire examined colon. - Erythematous mucosa in the recto-sigmoid colon. - Biopsies were taken with a cold forceps for histology in the entire colon and in the distal ileum.  1. Surgical [P], right colon random, biopsy - BENIGN COLONIC MUCOSA. - NO ACTIVE INFLAMMATION OR EVIDENCE OF MICROSCOPIC COLITIS. - NO DYSPLASIA OR MALIGNANCY. 2. Surgical [P], terminal ileum random biopsy - BENIGN SMALL BOWEL MUCOSA. - NO ACTIVE INFLAMMATION. - NO DYSPLASIA OR MALIGNANCY. 3. Surgical [P], left colon biopsy - CHRONIC  MILDLY ACTIVE COLITIS, SEE COMMENT. - NO DYSPLASIA OR MALIGNANCY.   Past Medical History:  Diagnosis Date   Asthma    Colitis    Past Surgical History:  Procedure Laterality Date   ANKLE FRACTURE SURGERY     CLAVICLE SURGERY     COLONOSCOPY     WISDOM TOOTH EXTRACTION      reports that he has been smoking cigars. He has never used smokeless tobacco. He reports current alcohol use of about 5.0 - 6.0 standard drinks per week. He reports that he does not use drugs. family history includes Heart disease in his maternal grandfather; Prostate cancer in his father; Skin cancer in his father. Allergies  Allergen Reactions   Azithromycin Rash      Outpatient Encounter Medications as of 05/04/2021  Medication Sig   acetaminophen (TYLENOL) 500 MG tablet Take 1,000 mg by mouth every 6 (six) hours as needed for mild pain or fever.   albuterol (VENTOLIN HFA) 108 (90 Base) MCG/ACT inhaler Inhale 2 puffs into the lungs every 6 (six) hours as needed for wheezing or shortness of breath.   ibuprofen (ADVIL) 200 MG tablet Take 600 mg by mouth every 6 (six) hours as needed for fever or moderate pain.   Multiple Vitamins-Minerals (MULTIVITAMIN WITH MINERALS) tablet Take 1 tablet by mouth daily.   No facility-administered encounter medications on file as of 05/04/2021.     REVIEW OF SYSTEMS  : All other systems reviewed and negative except where  noted in the History of Present Illness.   PHYSICAL EXAM: BP (!) 132/98 (BP Location: Right Arm, Patient Position: Sitting)    Pulse (!) 56    Ht 6\' 3"  (1.905 m)    Wt 230 lb 12.8 oz (104.7 kg)    SpO2 97%    BMI 28.85 kg/m  General: Well developed male in no acute distress Head: Normocephalic and atraumatic Eyes:  Sclerae anicteric, conjunctiva pink. Ears: Normal auditory acuity Lungs: Clear throughout to auscultation; no W/R/R. Heart: Regular rate and rhythm; no M/R/G. Abdomen: Soft, non-distended.  BS present.  Non-tender. Rectal:  Will be done at  the time of colonoscopy. Musculoskeletal: Symmetrical with no gross deformities  Skin: No lesions on visible extremities Extremities: No edema  Neurological: Alert oriented x 4, grossly non-focal Psychological:  Alert and cooperative. Normal mood and affect  ASSESSMENT AND PLAN: *Left-sided ulcerative colitis/proctosigmoiditis: Previously on Lialda 4.8 g daily and Canasa suppositories at bedtime.  Has not been seen here since 2017.  Has not been on medication probably since 2019 when he last had communication with [place].  CBC, CMP, TSH all looked good on his routine labs in 2022.  He would like to restart his medications, but would also like to repeat colonoscopy for reassessment as well.  He is being scheduled Dr. Silverio Decamp.  Prescriptions for Lialda 4.8 grams daily and Canasa suppositories were sent to his pharmacy.   *Epigastric abdominal pain/GERD: Was having epigastric abdominal pain while taking a bunch of different medication for COVID.  He discontinued those and took some pantoprazole and the abdominal pain has resolved.  He is requesting EGD as well.  Will schedule that with Dr. Silverio Decamp also.  **The risks, benefits, and alternatives to EGD and colonoscopy were discussed with the patient and he consents to proceed.   CC:  Vivi Barrack, MD

## 2021-05-04 NOTE — Patient Instructions (Signed)
We have sent the following medications to your pharmacy for you to pick up at your convenience: Georgiann Mccoy, Doristine Johns, and Suprep  You have been scheduled for an endoscopy and colonoscopy. Please follow the written instructions given to you at your visit today. Please pick up your prep supplies at the pharmacy within the next 1-3 days. If you use inhalers (even only as needed), please bring them with you on the day of your procedure.   Due to recent changes in healthcare laws, you may see the results of your imaging and laboratory studies on MyChart before your provider has had a chance to review them.  We understand that in some cases there may be results that are confusing or concerning to you. Not all laboratory results come back in the same time frame and the provider may be waiting for multiple results in order to interpret others.  Please give [place] 48 hours in order for your provider to thoroughly review all the results before contacting the office for clarification of your results.   If you are age 42 or older, your body mass index should be between 23-30. Your Body mass index is 28.85 kg/m. If this is out of the aforementioned range listed, please consider follow up with your Primary Care Provider.  If you are age 49 or younger, your body mass index should be between 19-25. Your Body mass index is 28.85 kg/m. If this is out of the aformentioned range listed, please consider follow up with your Primary Care Provider.   ________________________________________________________  The [facility] providers would like to encourage you to use [facility] to communicate with providers for non-urgent requests or questions.  Due to long hold times on the telephone, sending your provider a message by [facility] may be a faster and more efficient way to get a response.  Please allow 48 business hours for a response.  Please remember that this is for non-urgent requests.   _______________________________________________________

## 2021-05-08 ENCOUNTER — Encounter: Payer: Self-pay | Admitting: Gastroenterology

## 2021-05-08 ENCOUNTER — Telehealth: Payer: Self-pay

## 2021-05-08 DIAGNOSIS — R1013 Epigastric pain: Secondary | ICD-10-CM | POA: Insufficient documentation

## 2021-05-08 NOTE — Telephone Encounter (Signed)
-----   Message from Loralie Champagne, PA-C sent at 05/08/2021 11:44 AM EST ----- I saw this patient last Friday.  We talked about possibly doing a 2-day bowel prep since his prep on his colonoscopy in 2017 was not good.  I kind of bounced it around and then decided not to do it since he doesn't have constipation at baseline, but as I thought about it further I feel like maybe we should because I would hate to have him come in with another poor prep.  Would you be able to instruct him on the 2-day bowel prep.  He obviously has all of his original instructions, would just need instructed on the 2 days of clear liquids and the first night of bowel prep with MiraLAX and Dulcolax or what ever the protocol is.  Thank you,  Jess

## 2021-05-08 NOTE — Telephone Encounter (Signed)
I have spoken with the pt and made him aware of the 2 day prep.  New instructions have been sent to My Chart. He will let us know if there are any questions after reviewing the new instructions.

## 2021-05-15 NOTE — Progress Notes (Signed)
Reviewed and agree with documentation and assessment and plan. K. Veena Lucresia Simic , MD   

## 2021-06-12 ENCOUNTER — Encounter: Payer: Self-pay | Admitting: Gastroenterology

## 2021-06-18 ENCOUNTER — Ambulatory Visit (AMBULATORY_SURGERY_CENTER): Payer: 59 | Admitting: Gastroenterology

## 2021-06-18 ENCOUNTER — Encounter: Payer: Self-pay | Admitting: Gastroenterology

## 2021-06-18 VITALS — BP 107/67 | HR 49 | Temp 97.5°F | Resp 17 | Ht 75.0 in | Wt 230.0 lb

## 2021-06-18 DIAGNOSIS — K635 Polyp of colon: Secondary | ICD-10-CM | POA: Diagnosis not present

## 2021-06-18 DIAGNOSIS — R1013 Epigastric pain: Secondary | ICD-10-CM

## 2021-06-18 DIAGNOSIS — K51911 Ulcerative colitis, unspecified with rectal bleeding: Secondary | ICD-10-CM

## 2021-06-18 DIAGNOSIS — K30 Functional dyspepsia: Secondary | ICD-10-CM | POA: Diagnosis not present

## 2021-06-18 DIAGNOSIS — K2970 Gastritis, unspecified, without bleeding: Secondary | ICD-10-CM | POA: Diagnosis not present

## 2021-06-18 DIAGNOSIS — D120 Benign neoplasm of cecum: Secondary | ICD-10-CM

## 2021-06-18 DIAGNOSIS — K3189 Other diseases of stomach and duodenum: Secondary | ICD-10-CM | POA: Diagnosis not present

## 2021-06-18 DIAGNOSIS — K629 Disease of anus and rectum, unspecified: Secondary | ICD-10-CM | POA: Diagnosis not present

## 2021-06-18 MED ORDER — MESALAMINE 1.2 G PO TBEC: 1.2000 g | DELAYED_RELEASE_TABLET | Freq: Every day | ORAL | 11 refills | Status: DC

## 2021-06-18 MED ORDER — MESALAMINE 1.2 G PO TBEC: 2.4000 g | DELAYED_RELEASE_TABLET | Freq: Every day | ORAL | 3 refills | Status: DC

## 2021-06-18 MED ORDER — SODIUM CHLORIDE 0.9 % IV SOLN: 500.0000 mL | Freq: Once | INTRAVENOUS | Status: DC

## 2021-06-18 NOTE — Op Note (Signed)
[facility] Patient Name: Matthew Holt Procedure Date: 06/18/2021 2:10 PM MRN: [id number] Endoscopist: Mauri Pole , MD Age: 46 Referring MD:  Date of Birth: [birth year] Gender: Male Account #: [id number] Procedure:                Colonoscopy Indications:              Follow-up of left-sided chronic ulcerative colitis,                            Disease activity assessment of left-sided chronic                            ulcerative colitis Medicines:                Monitored Anesthesia Care Procedure:                Pre-Anesthesia Assessment:                           - Prior to the procedure, a History and Physical                            was performed, and patient medications and                            allergies were reviewed. The patient's tolerance of                            previous anesthesia was also reviewed. The risks                            and benefits of the procedure and the sedation                            options and risks were discussed with the patient.                            All questions were answered, and informed consent                            was obtained. Prior Anticoagulants: The patient has                            taken no previous anticoagulant or antiplatelet                            agents. ASA Grade Assessment: II - A patient with                            mild systemic disease. After reviewing the risks                            and benefits, the patient was deemed in  satisfactory condition to undergo the procedure.                           After obtaining informed consent, the colonoscope                            was passed under direct vision. Throughout the                            procedure, the patient's blood pressure, pulse, and                            oxygen saturations were monitored continuously. The                            Olympus [id number]-[id number] (#[id number])  Colonoscope was                            introduced through the anus and advanced to the the                            cecum, identified by appendiceal orifice and                            ileocecal valve. The colonoscopy was performed                            without difficulty. The patient tolerated the                            procedure well. The quality of the bowel                            preparation was good. The ileocecal valve,                            appendiceal orifice, and rectum were photographed. Scope In: 2:30:51 PM Scope Out: 2:44:30 PM Scope Withdrawal Time: 0 hours 9 minutes 33 seconds  Total Procedure Duration: 0 hours 13 minutes 39 seconds  Findings:                 The perianal and digital rectal examinations were                            normal.                           A 1 mm polyp was found in the cecum. The polyp was                            sessile. The polyp was removed with a cold biopsy                            forceps. Resection and retrieval were complete.  A scattered area of mildly erythematous mucosa was                            found in the rectum. Biopsies were taken with a                            cold forceps for histology.                           Normal mucosa was found in the sigmoid colon, in                            the descending colon, in the transverse colon, in                            the ascending colon and in the cecum. Biopsies were                            taken with a cold forceps for histology.                           Non-bleeding external and internal hemorrhoids were                            found during retroflexion. The hemorrhoids were                            small. Complications:            No immediate complications. Estimated Blood Loss:     Estimated blood loss was minimal. Impression:               - One 1 mm polyp in the cecum, removed with a cold                             biopsy forceps. Resected and retrieved.                           - Erythematous mucosa in the rectum. Biopsied.                           - Normal mucosa in the sigmoid colon, in the                            descending colon, in the transverse colon, in the                            ascending colon and in the cecum. Biopsied.                           - Non-bleeding external and internal hemorrhoids. Recommendation:           - Patient has a contact number available for  emergencies. The signs and symptoms of potential                            delayed complications were discussed with the                            patient. Return to normal activities tomorrow.                            Written discharge instructions were provided to the                            patient.                           - Resume previous diet.                           - Continue present medications.                           - Await pathology results.                           - Repeat colonoscopy in 3 - 5 years for                            surveillance based on pathology results.                           - Return to GI clinic at the next available                            appointment in 3 months.                           - Use Lialda 1.2 gm at 2 tabs PO daily. Rx for 30                            days with 11 refills. Mauri Pole, MD 06/18/2021 2:55:13 PM This report has been signed electronically.

## 2021-06-18 NOTE — Progress Notes (Signed)
[facility] History and Physical   Primary Care Physician:  Vivi Barrack, MD   Reason for Procedure:  Epigastric abdominal pain, ulcerative colitis and GERD  Plan:    EGD and colonoscopy with possible interventions as needed     HPI: Matthew Holt is a very pleasant 46 y.o. male here for EGD and colonoscopy. H/o left side ulcerative colitis, will need to assess disease activity and exclude active inflammation. Intermittent epigastric pain and GERD symptoms will need to exclude peptic ulcer disease, H.pylori. Please refer to office visit 05/04/21 by Alonza Bogus for additional details.   The risks and benefits as well as alternatives of endoscopic procedure(s) have been discussed and reviewed. All questions answered. The patient agrees to proceed.    Past Medical History:  Diagnosis Date   Asthma    Colitis     Past Surgical History:  Procedure Laterality Date   ANKLE FRACTURE SURGERY     CLAVICLE SURGERY     COLONOSCOPY     WISDOM TOOTH EXTRACTION      Prior to Admission medications   Medication Sig Start Date End Date Taking? Authorizing Provider  Multiple Vitamins-Minerals (MULTIVITAMIN WITH MINERALS) tablet Take 1 tablet by mouth daily.   Yes [provider]  acetaminophen (TYLENOL) 500 MG tablet Take 1,000 mg by mouth every 6 (six) hours as needed for mild pain or fever.    [provider]  albuterol (VENTOLIN HFA) 108 (90 Base) MCG/ACT inhaler Inhale 2 puffs into the lungs every 6 (six) hours as needed for wheezing or shortness of breath. Patient not taking: Reported on 06/18/2021 04/27/21   Ailene Ards, NP  ibuprofen (ADVIL) 200 MG tablet Take 600 mg by mouth every 6 (six) hours as needed for fever or moderate pain.    [provider]  mesalamine (CANASA) 1000 MG suppository Place 1 suppository (1,000 mg total) rectally at bedtime. 05/04/21   Zehr, Laban Emperor, PA-C  mesalamine (LIALDA) 1.2 g EC tablet Take 4 tablets (4.8 g total)  by mouth daily with breakfast. 05/04/21 06/03/21  Zehr, Laban Emperor, PA-C    Current Outpatient Medications  Medication Sig Dispense Refill   Multiple Vitamins-Minerals (MULTIVITAMIN WITH MINERALS) tablet Take 1 tablet by mouth daily.     acetaminophen (TYLENOL) 500 MG tablet Take 1,000 mg by mouth every 6 (six) hours as needed for mild pain or fever.     albuterol (VENTOLIN HFA) 108 (90 Base) MCG/ACT inhaler Inhale 2 puffs into the lungs every 6 (six) hours as needed for wheezing or shortness of breath. (Patient not taking: Reported on 06/18/2021) 8 g 0   ibuprofen (ADVIL) 200 MG tablet Take 600 mg by mouth every 6 (six) hours as needed for fever or moderate pain.     mesalamine (CANASA) 1000 MG suppository Place 1 suppository (1,000 mg total) rectally at bedtime. 30 suppository 2   mesalamine (LIALDA) 1.2 g EC tablet Take 4 tablets (4.8 g total) by mouth daily with breakfast. 120 tablet 2   Current Facility-Administered Medications  Medication Dose Route Frequency Provider Last Rate Last Admin   0.9 %  sodium chloride infusion  500 mL Intravenous Once Mauri Pole, MD        Allergies as of 06/18/2021 - Review Complete 06/18/2021  Allergen Reaction Noted   Azithromycin Rash 07/05/2019    Family History  Problem Relation Age of Onset   Prostate cancer Father    Skin cancer Father    Heart disease Maternal  Grandfather    Colon cancer Neg Hx    Esophageal cancer Neg Hx    Stomach cancer Neg Hx    Pancreatic cancer Neg Hx    Liver disease Neg Hx     Social History   Socioeconomic History   Marital status: Married    Spouse name: Not on file   Number of children: Not on file   Years of education: Not on file   Highest education level: Not on file  Occupational History   Not on file  Tobacco Use   Smoking status: Light Smoker    Types: Cigars   Smokeless tobacco: Never   Tobacco comments:    cigars on occasion  Vaping Use   Vaping Use: Never used  Substance and  Sexual Activity   Alcohol use: Yes    Alcohol/week: 5.0 - 6.0 standard drinks    Types: 3 - 4 Cans of beer, 2 Standard drinks or equivalent per week   Drug use: No   Sexual activity: Not on file  Other Topics Concern   Not on file  Social History Narrative   Not on file   Social Determinants of Health   Financial Resource Strain: Not on file  Food Insecurity: Not on file  Transportation Needs: Not on file  Physical Activity: Not on file  Stress: Not on file  Social Connections: Not on file  Intimate Partner Violence: Not on file    Review of Systems:  All other review of systems negative except as mentioned in the HPI.  Physical Exam: Vital signs in last 24 hours: BP (!) 140/53    Pulse (!) 53    Temp (!) 97.5 F (36.4 C) (Temporal)    Ht 6\' 3"  (1.905 m)    Wt 230 lb (104.3 kg)    SpO2 100%    BMI 28.75 kg/m  General:   Alert, NAD Lungs:  Clear .   Heart:  Regular rate and rhythm Abdomen:  Soft, nontender and nondistended. Neuro/Psych:  Alert and cooperative. Normal mood and affect. A and O x 3  Reviewed labs, radiology imaging, old records and pertinent past GI work up  Patient is appropriate for planned procedure(s) and anesthesia in an ambulatory setting   K. Denzil Magnuson , MD [phone]

## 2021-06-18 NOTE — Progress Notes (Signed)
Sedate, gd SR, tolerated procedure well, VSS, report to RN 

## 2021-06-18 NOTE — Op Note (Signed)
[facility] Patient Name: Matthew Holt Procedure Date: 06/18/2021 2:10 PM MRN: [id number] Endoscopist: Mauri Pole , MD Age: 46 Referring MD:  Date of Birth: 11/24/1975 Gender: Male Account #: [id number] Procedure:                Upper GI endoscopy Indications:              Epigastric abdominal pain, Dyspepsia Medicines:                Monitored Anesthesia Care Procedure:                Pre-Anesthesia Assessment:                           - Prior to the procedure, a History and Physical                            was performed, and patient medications and                            allergies were reviewed. The patient's tolerance of                            previous anesthesia was also reviewed. The risks                            and benefits of the procedure and the sedation                            options and risks were discussed with the patient.                            All questions were answered, and informed consent                            was obtained. Prior Anticoagulants: The patient has                            taken no previous anticoagulant or antiplatelet                            agents. ASA Grade Assessment: II - A patient with                            mild systemic disease. After reviewing the risks                            and benefits, the patient was deemed in                            satisfactory condition to undergo the procedure.                           After obtaining informed consent, the endoscope was  passed under direct vision. Throughout the                            procedure, the patient's blood pressure, pulse, and                            oxygen saturations were monitored continuously. The                            Olympus Endoscope [id number] was introduced through                            the mouth, and advanced to the second part of                            duodenum. The upper  GI endoscopy was accomplished                            without difficulty. The patient tolerated the                            procedure well. Scope In: Scope Out: Findings:                 The Z-line was regular and was found 36 cm from the                            incisors.                           The exam of the esophagus was otherwise normal.                           Patchy mild inflammation characterized by                            congestion (edema), erythema and mucus was found in                            the gastric antrum and in the prepyloric region of                            the stomach. Biopsies were taken with a cold                            forceps for Helicobacter pylori testing.                           The cardia and gastric fundus were normal on                            retroflexion.                           The examined duodenum was normal. Complications:  No immediate complications. Estimated Blood Loss:     Estimated blood loss was minimal. Impression:               - Z-line regular, 36 cm from the incisors.                           - Gastritis. Biopsied.                           - Normal examined duodenum. Recommendation:           - Patient has a contact number available for                            emergencies. The signs and symptoms of potential                            delayed complications were discussed with the                            patient. Return to normal activities tomorrow.                            Written discharge instructions were provided to the                            patient.                           - Resume previous diet.                           - Continue present medications.                           - Await pathology results. Mauri Pole, MD 06/18/2021 2:50:37 PM This report has been signed electronically.

## 2021-06-18 NOTE — Patient Instructions (Signed)
Handouts on polyps and gastritis given.    YOU HAD AN ENDOSCOPIC PROCEDURE TODAY AT [facility]:   Refer to the procedure report that was given to you for any specific questions about what was found during the examination.  If the procedure report does not answer your questions, please call your gastroenterologist to clarify.  If you requested that your care partner not be given the details of your procedure findings, then the procedure report has been included in a sealed envelope for you to review at your convenience later.  YOU SHOULD EXPECT: Some feelings of bloating in the abdomen. Passage of more gas than usual.  Walking can help get rid of the air that was put into your GI tract during the procedure and reduce the bloating. If you had a lower endoscopy (such as a colonoscopy or flexible sigmoidoscopy) you may notice spotting of blood in your stool or on the toilet paper. If you underwent a bowel prep for your procedure, you may not have a normal bowel movement for a few days.  Please Note:  You might notice some irritation and congestion in your nose or some drainage.  This is from the oxygen used during your procedure.  There is no need for concern and it should clear up in a day or so.  SYMPTOMS TO REPORT IMMEDIATELY:  Following lower endoscopy (colonoscopy or flexible sigmoidoscopy):  Excessive amounts of blood in the stool  Significant tenderness or worsening of abdominal pains  Swelling of the abdomen that is new, acute  Fever of 100F or higher  Following upper endoscopy (EGD)  Vomiting of blood or coffee ground material  New chest pain or pain under the shoulder blades  Painful or persistently difficult swallowing  New shortness of breath  Fever of 100F or higher  Black, tarry-looking stools  For urgent or emergent issues, a gastroenterologist can be reached at any hour by calling [phone]. Do not use MyChart messaging for urgent concerns.    DIET:   We do recommend a small meal at first, but then you may proceed to your regular diet.  Drink plenty of fluids but you should avoid alcoholic beverages for 24 hours.  ACTIVITY:  You should plan to take it easy for the rest of today and you should NOT DRIVE or use heavy machinery until tomorrow (because of the sedation medicines used during the test).    FOLLOW UP: Our staff will call the number listed on your records 48-72 hours following your procedure to check on you and address any questions or concerns that you may have regarding the information given to you following your procedure. If we do not reach you, we will leave a message.  We will attempt to reach you two times.  During this call, we will ask if you have developed any symptoms of COVID 19. If you develop any symptoms (ie: fever, flu-like symptoms, shortness of breath, cough etc.) before then, please call [phone].  If you test positive for Covid 19 in the 2 weeks post procedure, please call and report this information to [place].    If any biopsies were taken you will be contacted by phone or by letter within the next 1-3 weeks.  Please call us at [phone] if you have not heard about the biopsies in 3 weeks.    SIGNATURES/CONFIDENTIALITY: You and/or your care partner have signed paperwork which will be entered into your electronic medical record.  These signatures attest to the  fact that that the information above on your After Visit Summary has been reviewed and is understood.  Full responsibility of the confidentiality of this discharge information lies with you and/or your care-partner.

## 2021-06-18 NOTE — Progress Notes (Signed)
Pt's states no medical or surgical changes since previsit or office visit. 

## 2021-06-18 NOTE — Progress Notes (Signed)
Called to room to assist during endoscopic procedure.  Patient ID and intended procedure confirmed with present staff. Received instructions for my participation in the procedure from the performing physician.  

## 2021-06-20 ENCOUNTER — Telehealth: Payer: Self-pay | Admitting: *Deleted

## 2021-06-20 NOTE — Telephone Encounter (Signed)
?  Follow up Call- ? ?Call back number 06/18/2021  ?Post procedure Call Back phone  # [phone]  ?Permission to leave phone message Yes  ?Some recent data might be hidden  ?  ? ?Patient questions: ? ?Do you have a fever, pain , or abdominal swelling? No. ?Pain Score  0 * ? ?Have you tolerated food without any problems? Yes.   ? ?Have you been able to return to your normal activities? Yes.   ? ?Do you have any questions about your discharge instructions: ?Diet   No. ?Medications  No. ?Follow up visit  No. ? ?Do you have questions or concerns about your Care? No. ? ?Actions: ?* If pain score is 4 or above: ?No action needed, pain <4. ? ? ?

## 2021-06-28 ENCOUNTER — Encounter: Payer: Self-pay | Admitting: Gastroenterology

## 2021-07-31 ENCOUNTER — Other Ambulatory Visit: Payer: Self-pay | Admitting: Gastroenterology

## 2021-12-12 ENCOUNTER — Encounter: Payer: Self-pay | Admitting: Gastroenterology

## 2021-12-12 ENCOUNTER — Ambulatory Visit: Payer: 59 | Admitting: Gastroenterology

## 2021-12-12 VITALS — BP 120/80 | HR 57 | Ht 75.0 in | Wt 222.4 lb

## 2021-12-12 DIAGNOSIS — R21 Rash and other nonspecific skin eruption: Secondary | ICD-10-CM | POA: Insufficient documentation

## 2021-12-12 DIAGNOSIS — L290 Pruritus ani: Secondary | ICD-10-CM | POA: Diagnosis not present

## 2021-12-12 NOTE — Progress Notes (Signed)
12/12/2021 Matthew Holt [id number] 18-Jul-1975   HISTORY OF PRESENT ILLNESS: This is a 46 year old male who is a patient of Dr. Woodward Ku who has left-sided colitis/rectosigmoiditis.  Was last seen here earlier this year and underwent EGD and colonoscopy as below.  He has Lialda and Canasa suppositories at home that he just uses on an as-needed basis.  He says that he controls his symptoms well with diet alone for the most part  His bowels are moving well with normal stools and no bleeding.  No abdominal pain.  He is here today with complaints of perianal itching.  He says been present for about the past 3 months.  Says it is driving him crazy, waking him up at night.  He says tried several over-the-counter regimens including Neosporin, Goldbond powder, witch hazel, hydrocortisone cream, Preparation H and has tried his Canasa suppositories as well.  This itching is definitely external and extends into the perineal region away from the anus as well.  He says he also tried pinworm treatment over-the-counter.  He says that sometimes he will notice some spots of blood on the toilet paper from it when wiping as well..  Colonoscopy 05/2021:  - One 1 mm polyp in the cecum, removed with a cold biopsy forceps. Resected and retrieved. - Erythematous mucosa in the rectum. Biopsied. - Normal mucosa in the sigmoid colon, in the descending colon, in the transverse colon, in the ascending colon and in the cecum. Biopsied. - Non-bleeding external and internal hemorrhoids.  EGD 05/2021:  - Z-line regular, 36 cm from the incisors. - Gastritis. Biopsied. - Normal examined duodenum.  1. Surgical [P], gastric antrum and gastric body - ANTRAL AND OXYNTIC MUCOSA WITH HYPEREMIA. - NO HELICOBACTER PYLORI IDENTIFIED. 2. Surgical [P], colon, cecum, polyp (1) - COLONIC MUCOSA WITH BENIGN LYMPHOID AGGREGATE. - MULTIPLE ADDITIONAL LEVELS EXAMINED. 3. Surgical [P], random right colon sites - UNREMARKABLE COLONIC  MUCOSA. - NO ACTIVE INFLAMMATION OR CHRONIC CHANGES. - NEGATIVE FOR DYSPLASIA. 4. Surgical [P], random left colon sites - UNREMARKABLE COLONIC MUCOSA. - NO ACTIVE INFLAMMATION OR CHRONIC CHANGES. - NEGATIVE FOR DYSPLASIA. 5. Surgical [P], colon, rectum - COLONIC MUCOSA WITH REACTIVE LYMPHOID AGGREGATES AND MILD CHRONIC CHANGES. - NO ACTIVE INFLAMMATION IDENTIFIED. - NEGATIVE FOR DYSPLASIA.    Past Medical History:  Diagnosis Date   Asthma    Colitis    Past Surgical History:  Procedure Laterality Date   ANKLE FRACTURE SURGERY     CLAVICLE SURGERY     COLONOSCOPY     WISDOM TOOTH EXTRACTION      reports that he has been smoking cigars. He has never used smokeless tobacco. He reports current alcohol use of about 5.0 - 6.0 standard drinks of alcohol per week. He reports that he does not use drugs. family history includes Heart disease in his maternal grandfather; Prostate cancer in his father; Skin cancer in his father. Allergies  Allergen Reactions   Azithromycin Rash      Outpatient Encounter Medications as of 12/12/2021  Medication Sig   acetaminophen (TYLENOL) 500 MG tablet Take 1,000 mg by mouth every 6 (six) hours as needed for mild pain or fever.   ibuprofen (ADVIL) 200 MG tablet Take 600 mg by mouth every 6 (six) hours as needed for fever or moderate pain.   mesalamine (CANASA) 1000 MG suppository PLACE 1 SUPPOSITORY (1,000 MG TOTAL) RECTALLY AT BEDTIME.   mesalamine (LIALDA) 1.2 g EC tablet Take 2 tablets (2.4 g total) by mouth daily  with breakfast.   albuterol (VENTOLIN HFA) 108 (90 Base) MCG/ACT inhaler Inhale 2 puffs into the lungs every 6 (six) hours as needed for wheezing or shortness of breath. (Patient not taking: Reported on 12/12/2021)   Multiple Vitamins-Minerals (MULTIVITAMIN WITH MINERALS) tablet Take 1 tablet by mouth daily.   No facility-administered encounter medications on file as of 12/12/2021.    REVIEW OF SYSTEMS  : All other systems reviewed and  negative except where noted in the History of Present Illness.   PHYSICAL EXAM: BP 120/80   Pulse (!) 57   Ht '6\' 3"'$  (1.905 m)   Wt 222 lb 6.4 oz (100.9 kg)   SpO2 100%   BMI 27.80 kg/m  General: Well developed male in no acute distress Head: Normocephalic and atraumatic Eyes:  Sclerae anicteric, conjunctiva pink. Ears: Normal auditory acuity Lungs: Clear throughout to auscultation; no W/R/R. Heart: Regular rate and rhythm; no M/R/G. Rectal: Only external abnormality that was noted was some perianal fissuring/skin breakdown. Musculoskeletal: Symmetrical with no gross deformities  Skin: No lesions on visible extremities Extremities: No edema  Neurological: Alert oriented x 4, grossly non-focal Psychological:  Alert and cooperative. Normal mood and affect  ASSESSMENT AND PLAN: *Perianal rash and itching: Has been present for about the past 3 months.  He has tried several over-the-counter regimens including pinworm treatment.  By exam today he has some perianal fissuring that looks like skin breakdown.  It is likely from being very moist in the area with the high temperatures and sweating.  Advised him to keep the area dry after showering, exercising, etc, but not using a towel to rub the area (recommended using a fan or blow dryer on cool setting).  Avoid ointments on the area.  I recommended that he try Desitin/zinc oxide extra strength to the area multiple times per day.  He will message back in a few weeks with an update on his symptoms.           CC:  Vivi Barrack, MD

## 2021-12-12 NOTE — Patient Instructions (Signed)
If you are age 44 or older, your body mass index should be between 23-30. Your Body mass index is 27.8 kg/m. If this is out of the aforementioned range listed, please consider follow up with your Primary Care Provider.  If you are age 36 or younger, your body mass index should be between 19-25. Your Body mass index is 27.8 kg/m. If this is out of the aformentioned range listed, please consider follow up with your Primary Care Provider.   ________________________________________________________  The [facility] providers would like to encourage you to use [facility] to communicate with providers for non-urgent requests or questions.  Due to long hold times on the telephone, sending your provider a message by [facility] may be a faster and more efficient way to get a response.  Please allow 48 business hours for a response.  Please remember that this is for non-urgent requests.  _______________________________________________________   Try over the counter Desitin extra strength a few times a day. Keep perianal area dry.  Please send a message in a few weeks with an update.  It was a pleasure to see you today!  Thank you for trusting me with your gastrointestinal care!

## 2021-12-28 ENCOUNTER — Ambulatory Visit
Admission: RE | Admit: 2021-12-28 | Discharge: 2021-12-28 | Disposition: A | Discharge status: Home / Self Care | Payer: 59 | Source: Ambulatory Visit | Attending: Emergency Medicine | Admitting: Emergency Medicine

## 2021-12-28 VITALS — BP 141/83 | HR 68 | Temp 98.2°F | Resp 16

## 2021-12-28 DIAGNOSIS — H66001 Acute suppurative otitis media without spontaneous rupture of ear drum, right ear: Secondary | ICD-10-CM | POA: Diagnosis not present

## 2021-12-28 MED ORDER — CEFDINIR 300 MG PO CAPS: 300.0000 mg | ORAL_CAPSULE | Freq: Two times a day (BID) | ORAL | 0 refills | Status: AC

## 2021-12-28 NOTE — ED Triage Notes (Signed)
Patient presents with right ear fullness x Tuesday. Pt has been using ear drops and rubbing alcohol. He states he went swimming.   Denies fever or ear drainage.

## 2021-12-28 NOTE — ED Provider Notes (Signed)
[facility]    CSN: [id number] Arrival date & time: 12/28/21  1600    HISTORY   Chief Complaint  Patient presents with   Ear Fullness    Pain in ear - Entered by patient   HPI Matthew Holt is a pleasant, 46 y.o. male who presents to urgent care today. Patient reports going swimming over Labor Day weekend and feeling like he got water in his right ear.  Patient states he has been using alcohol and a hair dryer to help "dry it out" but has not had any relief of his symptoms.  Patient states he now has pain in front of and just below his right ear along his jawline.  Patient states he has been otherwise well, has not had fever, cold, worsening allergies, runny nose.  Patient denies loss of hearing.    The history is provided by the patient.   Past Medical History:  Diagnosis Date   Asthma    Colitis    Patient Active Problem List   Diagnosis Date Noted   Perianal rash 12/12/2021   Anal itching 12/12/2021   Abdominal pain, epigastric 05/08/2021   Dyslipidemia 09/05/2020   Ulcerative colitis with rectal bleeding ([place])    Rectal bleeding 11/08/2014   Past Surgical History:  Procedure Laterality Date   ANKLE FRACTURE SURGERY     CLAVICLE SURGERY     COLONOSCOPY     WISDOM TOOTH EXTRACTION      Home Medications    Prior to Admission medications   Medication Sig Start Date End Date Taking? Authorizing Provider  acetaminophen (TYLENOL) 500 MG tablet Take 1,000 mg by mouth every 6 (six) hours as needed for mild pain or fever.    [provider]  albuterol (VENTOLIN HFA) 108 (90 Base) MCG/ACT inhaler Inhale 2 puffs into the lungs every 6 (six) hours as needed for wheezing or shortness of breath. Patient not taking: Reported on 12/12/2021 04/27/21   Ailene Ards, NP  ibuprofen (ADVIL) 200 MG tablet Take 600 mg by mouth every 6 (six) hours as needed for fever or moderate pain.    [provider]  mesalamine (CANASA) 1000 MG suppository PLACE 1  SUPPOSITORY (1,000 MG TOTAL) RECTALLY AT BEDTIME. 07/31/21   Zehr, Laban Emperor, PA-C  mesalamine (LIALDA) 1.2 g EC tablet Take 2 tablets (2.4 g total) by mouth daily with breakfast. 06/18/21   Mauri Pole, MD  Multiple Vitamins-Minerals (MULTIVITAMIN WITH MINERALS) tablet Take 1 tablet by mouth daily.    [provider]    Family History Family History  Problem Relation Age of Onset   Prostate cancer Father    Skin cancer Father    Heart disease Maternal Grandfather    Colon cancer Neg Hx    Esophageal cancer Neg Hx    Stomach cancer Neg Hx    Pancreatic cancer Neg Hx    Liver disease Neg Hx    Social History Social History   Tobacco Use   Smoking status: Light Smoker    Types: Cigars   Smokeless tobacco: Never   Tobacco comments:    cigars on occasion  Vaping Use   Vaping Use: Never used  Substance Use Topics   Alcohol use: Yes    Alcohol/week: 5.0 - 6.0 standard drinks of alcohol    Types: 3 - 4 Cans of beer, 2 Standard drinks or equivalent per week   Drug use: No   Allergies   Azithromycin  Review of Systems  Review of Systems Pertinent findings revealed after performing a 14 point review of systems has been noted in the history of present illness.  Physical Exam Triage Vital Signs ED Triage Vitals  Enc Vitals Group     BP 02/16/21 0827 (!) 147/82     Pulse Rate 02/16/21 0827 72     Resp 02/16/21 0827 18     Temp 02/16/21 0827 98.3 F (36.8 C)     Temp Source 02/16/21 0827 Oral     SpO2 02/16/21 0827 98 %     Weight --      Height --      Head Circumference --      Peak Flow --      Pain Score 02/16/21 0826 5     Pain Loc --      Pain Edu? --      Excl. in [place]? --   No data found.  Updated Vital Signs BP (!) 141/83 (BP Location: Right Arm)   Pulse 68   Temp 98.2 F (36.8 C) (Oral)   Resp 16   SpO2 95%   Physical Exam Vitals and nursing note reviewed.  Constitutional:      General: He is not in acute distress.    Appearance:  Normal appearance. He is not ill-appearing.  HENT:     Head: Normocephalic and atraumatic.     Salivary Glands: Right salivary gland is not diffusely enlarged or tender. Left salivary gland is not diffusely enlarged or tender.     Right Ear: Hearing, ear canal and external ear normal. No drainage. No middle ear effusion. There is no impacted cerumen. Tympanic membrane is injected, erythematous and retracted. Tympanic membrane is not bulging.     Left Ear: Hearing, tympanic membrane, ear canal and external ear normal. No drainage.  No middle ear effusion. There is no impacted cerumen. Tympanic membrane is not erythematous or bulging.     Nose: Nose normal. No nasal deformity, septal deviation, mucosal edema, congestion or rhinorrhea.     Right Turbinates: Not enlarged, swollen or pale.     Left Turbinates: Not enlarged, swollen or pale.     Right Sinus: No maxillary sinus tenderness or frontal sinus tenderness.     Left Sinus: No maxillary sinus tenderness or frontal sinus tenderness.     Mouth/Throat:     Lips: Pink. No lesions.     Mouth: Mucous membranes are moist. No oral lesions.     Pharynx: Oropharynx is clear. Uvula midline. No posterior oropharyngeal erythema or uvula swelling.     Tonsils: No tonsillar exudate. 0 on the right. 0 on the left.  Eyes:     General: Lids are normal.        Right eye: No discharge.        Left eye: No discharge.     Extraocular Movements: Extraocular movements intact.     Conjunctiva/sclera: Conjunctivae normal.     Right eye: Right conjunctiva is not injected.     Left eye: Left conjunctiva is not injected.  Neck:     Trachea: Trachea and phonation normal.  Cardiovascular:     Rate and Rhythm: Normal rate and regular rhythm.     Pulses: Normal pulses.     Heart sounds: Normal heart sounds. No murmur heard.    No friction rub. No gallop.  Pulmonary:     Effort: Pulmonary effort is normal. No accessory muscle usage, prolonged expiration or  respiratory distress.  Breath sounds: Normal breath sounds. No stridor, decreased air movement or transmitted upper airway sounds. No decreased breath sounds, wheezing, rhonchi or rales.  Chest:     Chest wall: No tenderness.  Musculoskeletal:        General: Normal range of motion.     Cervical back: Normal range of motion and neck supple. Normal range of motion.  Lymphadenopathy:     Cervical: No cervical adenopathy.  Skin:    General: Skin is warm and dry.     Findings: No erythema or rash.  Neurological:     General: No focal deficit present.     Mental Status: He is alert and oriented to person, place, and time.  Psychiatric:        Mood and Affect: Mood normal.        Behavior: Behavior normal.     Visual Acuity Right Eye Distance:   Left Eye Distance:   Bilateral Distance:    Right Eye Near:   Left Eye Near:    Bilateral Near:     UC Couse / Diagnostics / Procedures:     Radiology No results found.  Procedures Procedures (including critical care time) EKG  Pending results:  Labs Reviewed - No data to display  Medications Ordered in UC: Medications - No data to display  UC Diagnoses / Final Clinical Impressions(s)   I have reviewed the triage vital signs and the nursing notes.  Pertinent labs & imaging results that were available during my care of the patient were reviewed by me and considered in my medical decision making (see chart for details).    Final diagnoses:  Non-recurrent acute suppurative otitis media of right ear without spontaneous rupture of tympanic membrane   Patient provided with a 10-day course of cefdinir and encouraged to finish the entire 10 days for complete resolution of infection in his right inner ear.  Return precautions advised.  Ibuprofen recommended for pain.  ED Prescriptions     Medication Sig Dispense Auth. Provider   cefdinir (OMNICEF) 300 MG capsule Take 1 capsule (300 mg total) by mouth 2 (two) times daily for 10  days. 20 capsule Lynden Oxford Scales, PA-C      PDMP not reviewed this encounter.  Disposition Upon Discharge:  Condition: stable for discharge home Home: take medications as prescribed; routine discharge instructions as discussed; follow up as advised.  Patient presented with an acute illness with associated systemic symptoms and significant discomfort requiring urgent management. In my opinion, this is a condition that a prudent lay person (someone who possesses an average knowledge of health and medicine) may potentially expect to result in complications if not addressed urgently such as respiratory distress, impairment of bodily function or dysfunction of bodily organs.   Routine symptom specific, illness specific and/or disease specific instructions were discussed with the patient and/or caregiver at length.   As such, the patient has been evaluated and assessed, work-up was performed and treatment was provided in alignment with urgent care protocols and evidence based medicine.  Patient/parent/caregiver has been advised that the patient may require follow up for further testing and treatment if the symptoms continue in spite of treatment, as clinically indicated and appropriate.  If the patient was tested for COVID-19, Influenza and/or RSV, then the patient/parent/guardian was advised to isolate at home pending the results of his/her diagnostic coronavirus test and potentially longer if they're positive. I have also advised pt that if his/her COVID-19 test returns positive, it's recommended to self-isolate  for at least 10 days after symptoms first appeared AND until fever-free for 24 hours without fever reducer AND other symptoms have improved or resolved. Discussed self-isolation recommendations as well as instructions for household member/close contacts as per the [facility] and [place] [facility], and also gave patient the [place] packet with this information.  Patient/parent/caregiver has been advised to  return to the [facility] or PCP in 3-5 days if no better; to PCP or the Emergency Department if new signs and symptoms develop, or if the current signs or symptoms continue to change or worsen for further workup, evaluation and treatment as clinically indicated and appropriate  The patient will follow up with their current PCP if and as advised. If the patient does not currently have a PCP we will assist them in obtaining one.   The patient may need specialty follow up if the symptoms continue, in spite of conservative treatment and management, for further workup, evaluation, consultation and treatment as clinically indicated and appropriate.  Patient/parent/caregiver verbalized understanding and agreement of plan as discussed.  All questions were addressed during visit.  Please see discharge instructions below for further details of plan.  Discharge Instructions:   Discharge Instructions      Please begin cefdinir 1 capsule twice daily for the next 10 days to resolve the bacterial infection in your right inner ear.  You are welcome to continue swimming and doing regular activities as long as you are not feeling dizzy or having pain.  You are also welcome to take ibuprofen as needed for pain.  After 10 days, if you continue to have sensation of fullness or discomfort in your right ear, please return for repeat evaluation.    If you begin to feel better before you have finished 10 days of antibiotics, please remember that is very important to complete the entire 10-day course to completely eradicate all the bacteria from your inner ear.    Thank you for visiting urgent care today.      This office note has been dictated using Museum/gallery curator.  Unfortunately, this method of dictation can sometimes lead to typographical or grammatical errors.  I apologize for your inconvenience in advance if this occurs.  Please do not hesitate to reach out to me if clarification is needed.       Lynden Oxford Scales, [place] 12/28/21 [phone]

## 2021-12-28 NOTE — Discharge Instructions (Signed)
Please begin cefdinir 1 capsule twice daily for the next 10 days to resolve the bacterial infection in your right inner ear.  You are welcome to continue swimming and doing regular activities as long as you are not feeling dizzy or having pain.  You are also welcome to take ibuprofen as needed for pain.  After 10 days, if you continue to have sensation of fullness or discomfort in your right ear, please return for repeat evaluation.    If you begin to feel better before you have finished 10 days of antibiotics, please remember that is very important to complete the entire 10-day course to completely eradicate all the bacteria from your inner ear.    Thank you for visiting urgent care today.

## 2022-01-14 ENCOUNTER — Encounter: Payer: Self-pay | Admitting: *Deleted

## 2022-01-15 ENCOUNTER — Ambulatory Visit: Payer: 59 | Admitting: Internal Medicine

## 2022-01-15 ENCOUNTER — Encounter: Payer: Self-pay | Admitting: Internal Medicine

## 2022-01-15 VITALS — BP 124/71 | HR 63 | Temp 97.7°F | Ht 75.0 in | Wt 224.4 lb

## 2022-01-15 DIAGNOSIS — M1990 Unspecified osteoarthritis, unspecified site: Secondary | ICD-10-CM | POA: Diagnosis not present

## 2022-01-15 MED ORDER — PREDNISONE 20 MG PO TABS: ORAL_TABLET | ORAL | 0 refills | Status: DC

## 2022-01-15 NOTE — Progress Notes (Signed)
[place] [facility] [employer]:  [phone]   Routine Medical Office Visit  Patient:  Matthew Holt      Age: 46 y.o.       Sex:  male  Date:   01/15/2022  PCP:    Vivi Barrack, MD    [place] Provider: Loralee Pacas, MD  Assessment/Plan:     Inflammatory arthritis   Comprehensive Review of the Case: The patient is a 46 year old male who has been experiencing severe pain in his left hip and pelvis, as well as in his right foot for about a week. The pain is so severe that it hinders his ability to walk and rotate his foot, especially in the morning or at night. He also reports discomfort around his right ear and jaw area, which he believes might be a side effect of the medication mesalamine that he started taking a week and a half ago for his ulcerative colitis. He recently had an ear infection that was treated, but the discomfort persists. He denies having any fevers, arthritis in any other joints, recent infections other than the ear infection, or any recent new medications other than the mesalamine or the ear infection antibiotic. He also denies any recent travel, exposure to sick individuals, or new sexual contacts. He has no history of special types of arthritis like gout, rheumatoid arthritis, or lupus. He tested negative for COVID-19. No information is provided about his social history, physical examination, laboratory data, course of illness, or any imaging data or other studies.  Most Likely Dx:   a. Mesalamine-induced Lupus-like Syndrome: Mesalamine, a medication used to treat ulcerative colitis, can cause drug-induced lupus-like syndrome, which can present with joint pain and other systemic symptoms. The patient's joint pain could be a manifestation of this syndrome. The presence of antinuclear antibodies (ANA) and anti-histone antibodies could further suggest this diagnosis.  Expanded DDx:  b. Reactive Arthritis: Reactive arthritis could explain the  patient's joint pain and recent infection. This condition often occurs following an infection and can cause inflammation and pain in various joints. The presence of HLA-B27 antigen could further suggest this diagnosis.  c. Psoriatic Arthritis: Psoriatic arthritis could explain the patient's joint pain. This condition often occurs in individuals with autoimmune diseases and can cause inflammation and pain in various joints. The presence of skin psoriasis or nail changes could further suggest this diagnosis.  Alternative DDx:  d. Osteoarthritis: Osteoarthritis could explain the patient's joint pain. This condition often occurs in older individuals and can cause inflammation and pain in various joints. The presence of joint stiffness that improves with movement could further suggest this diagnosis.  e. Gout: Despite the patient's denial of a history of gout, this condition could still explain his joint pain. Gout often presents with severe joint pain that can be triggered by certain medications. The presence of monosodium urate crystals in joint fluid could further suggest this diagnosis.  f. Septic Arthritis: Septic arthritis could explain the patient's joint pain and recent infection. This condition often occurs following an infection and can cause inflammation and pain in various joints. The presence of bacteria in joint fluid could further suggest this diagnosis.  g. Lyme Disease: Despite the patient's denial of recent travel, Lyme disease could still be a possibility given his joint pain. Lyme disease can present with joint pain and other systemic symptoms. The presence of erythema migrans or a positive Western blot test could further suggest this diagnosis.  h. Rheumatoid Arthritis: Despite the  patient's denial of a history of rheumatoid arthritis, this condition could still explain his joint pain. Rheumatoid arthritis often presents with joint pain and other systemic symptoms. The presence of  rheumatoid factor or anti-cyclic citrullinated peptide antibodies could further suggest this diagnosis.  i. Ankylosing Spondylitis: Ankylosing spondylitis could explain the patient's joint pain. This condition often occurs in individuals with autoimmune diseases and can cause inflammation and pain in various joints. The presence of HLA-B27 antigen and radiographic evidence of sacroiliitis could further suggest this diagnosis.  j. Osteomyelitis: Osteomyelitis could explain the patient's joint pain and recent infection. This condition often occurs following an infection and can cause inflammation and pain in various joints. The presence of bone pain or a positive bone scan could further suggest this diagnosis.   #Severe Joint Pain and Discomfort  The patient is a 46 year old individual who presents with severe pain in the left hip, pelvis, and right foot, which has been ongoing for about a week. The pain is severe enough to interfere with walking and foot rotation, particularly in the morning and at night. The patient also reports discomfort around the right ear and jaw, which they attribute to the medication mesalamine, recently started for ulcerative colitis. The patient had a recent ear infection, but despite treatment, discomfort persists. The patient denies fevers, arthritis in other joints, recent infections (apart from the ear infection), or new medications other than mesalamine and the antibiotic for the ear infection. The patient also denies recent travel, exposure to sick individuals, or new sexual contacts. The patient has no history of specific types of arthritis such as gout, rheumatoid arthritis, or lupus. The patient tested negative for COVID-19. The differential diagnosis includes mesalamine-induced lupus-like syndrome, reactive arthritis, psoriatic arthritis, osteoarthritis, gout, septic arthritis, Lyme disease, rheumatoid arthritis, ankylosing spondylitis, and osteomyelitis.  Dx -  Complete blood count (CBC) - Erythrocyte sedimentation rate (ESR) - C-reactive protein (CRP) - Rheumatoid factor (RF) - Anti-cyclic citrullinated peptide (anti-CCP) antibodies - Antinuclear antibodies (ANA) - Anti-histone antibodies - HLA-B27 antigen - Joint fluid analysis for crystals and bacteria - Imaging studies (X-ray, MRI) of the affected joints - Skin examination for psoriasis or nail changes - Western blot test for Lyme disease - Bone scan for osteomyelitis  Tx - Discontinue mesalamine if mesalamine-induced lupus-like syndrome is confirmed - Nonsteroidal anti-inflammatory drugs (NSAIDs) for pain relief - Disease-modifying antirheumatic drugs (DMARDs) if rheumatoid arthritis, psoriatic arthritis, or ankylosing spondylitis is confirmed - Antibiotics if septic arthritis or Lyme disease is confirmed - Lifestyle modifications including physical therapy and exercise - Surgery may be considered in severe cases of osteoarthritis, ankylosing spondylitis, or osteomyelitis - Corticosteroids may be considered in severe cases of inflammation - Colchicine or urate-lowering therapy if gout is confirmed - Pain management and supportive care.   ---------------------------------- We discussed the recommended work-up above for ruling out all of the possible diagnoses using a shared decision-making process he decided he would like to see if he can get the problem to simply resolved with only steroids holding mesalamine and checking back in with his GI doctor.  He declined all lab work imaging and referral to Ortho for now for hip arthrocentesis.  He cited fear of needles as a reason for declining most of the testing.   Subjective:   MIKA ANASTASI is a 46 y.o. male with PMH significant for: Past Medical History:  Diagnosis Date   Asthma    Colitis      He main concern for today's visit is: Risk analyst Complaint  Patient presents with   Hip Pain    Left side   Oral Swelling    With right  side of tongue, throat and ear feeling slightly painful. Had a ear infection about two weeks ago.   Foot Pain    Right foot.     Additional physician collected history: He reports that for about a week or so he has had severe pain in his left hip and pelvis and also in his right foot is very painful good putting weight on getting out of bed in the morning difficulty even walk to the bathroom also it can be painful to like rotate on it at night.  In addition he has had a ear infection that was recently treated a week ago but is still has some discomfort around his right ear and jaw area.  He was thinks it might be a side effect of the medication mesalamine for his ulcerative colitis which he started a week and a half ago. Positive for: Normal vital signs Negative for: He reports he does not have any fevers any arthritis in any of the other joints besides the left hip and the right foot or any recent infection other than the ear infection any other recent new medications other than the mesalamine or the ear infection antibiotic (any weakness fatigue or weight loss. He reports that he has not been around anyone who is sick he has not recently traveled overseas or in camping trip to the rural areas he does not drink well water He reports that he has no new sexual contacts or risk for STDs in the last few months He denies any history of special types of arthritis like gout rheumatoid arthritis or lupus but does have known autoimmune disease ulcerative colitis Similar symptoms happened long ago with COVID so he did take COVID test to rule that out and it was and those were negative yesterday No history of rashes. The pain for the most part has been improving over the course of the day and then coming back at nighttime and in the morning The pain is mostly exacerbated by pressure by standing on the hip but it can also be worsened by dangling the hip however there is no pain with abducting the hip or with  flexing and extending the hip Also there is no tenderness in the hip or the foot where the pain is the pain feels like it goes straight through the middle of the foot from the top to the plantar surface and mainly is flared by putting weight on the foot but not by putting pressure on the foot by squeezing also in his mouth there is a white plaque on his tongue but the swelling is not obvious.  There is no tmj tenderness          Objective:  Physical Exam: BP 124/71 (BP Location: Right Arm, Patient Position: Sitting)   Pulse 63   Temp 97.7 F (36.5 C) (Temporal)   Ht _0  (1.905 m)   Wt 224 lb 6.4 oz (101.8 kg)   SpO2 97%   BMI 28.05 kg/m   He  is a polite, friendly, and genuine person Constitutional: NAD, AAO, not ill-appearing  Neuro: alert, no focal deficit obvious, articulate speech Psych: normal mood, behavior, thought content   Problem specific physical exam findings:  No rashes anywhere  The pain is mostly exacerbated by pressure by standing on the hip but it can also be worsened by dangling the hip however  there is no pain with abducting the hip or with flexing and extending the hip Also there is no tenderness in the hip or the foot where the pain is the pain feels like it goes straight through the middle of the foot from the top to the plantar surface and mainly is flared by putting weight on the foot but not by putting pressure on the foot by squeezing also in his mouth there is a white plaque on his tongue but the swelling is not obvious.  There is no tmj tenderness

## 2022-01-15 NOTE — Patient Instructions (Addendum)
It was a pleasure seeing you today!  Today the plan is...  Inflammatory arthritis -     predniSONE; Take 2 pills for 3 days, 1 pill for 4 days  Dispense: 10 tablet; Refill: 0   Try prednisone and let us know if it isn't working. Hold mesalamine and get an appointment with Dr. Silverio Decamp soon to address Take prednisone and nsaids with food and heartburn meds.   Loralee Pacas, MD   Return if symptoms worsen or fail to improve.   - If your condition fails to resolve or you have other questions / concerns: please contact me via phone [phone] or MyChart messaging.  - Please bring all your medicines to your next appointment. This is the best way for me to know exactly what you're taking.  - If your condition begins to worsen or become severe:  go to the ER.   IF you received an x-ray today, you will receive an invoice from [facility]. Please contact [facility] at [phone] with questions or concerns regarding your invoice.    IF you received labwork today, you will receive an invoice from [facility]. Please contact [facility] at [phone] with questions or concerns regarding your invoice.    Our billing staff will not be able to assist you with questions regarding bills from these companies.   --------------------------------------------------------------------------------------------------------------------  You will be contacted with the lab results as soon as they are available. The fastest way to get your results is to activate your My Chart account. Instructions are located on the last page of this paperwork. If you have not heard from [place] regarding the results in 2 weeks, please contact this office. For any labs or imaging tests, we will call you if the results are significantly abnormal.  Most normal results will be posted to myChart as soon as they are available and I will comment on them there within 2-3 business days.

## 2022-01-22 ENCOUNTER — Telehealth: Payer: Self-pay | Admitting: Gastroenterology

## 2022-01-22 ENCOUNTER — Ambulatory Visit: Payer: 59 | Admitting: Physician Assistant

## 2022-01-22 NOTE — Telephone Encounter (Signed)
I have called and made the pt aware of the information per Janett Billow.  He will keep the appt with Dr Silverio Decamp as a follow up.  He will call back if he has any further concerns or questions.

## 2022-01-22 NOTE — Telephone Encounter (Signed)
Inbound call from patient requesting a call backt to discuss his medication and the side effects of it. Possibly wanting to switch to an alternative. Please advise.

## 2022-01-22 NOTE — Progress Notes (Unsigned)
    Matthew Holt D.[place] Phone: [phone]   Assessment and Plan:     There are no diagnoses linked to this encounter.  ***   Pertinent previous records reviewed include ***   Follow Up: ***     Subjective:   I, Matthew Holt, am serving as a Education administrator for Matthew Holt  Chief Complaint: left hip, pelvis and right foot pain   HPI:   01/23/2022 Patient is a 46 year old male complaining of left hip , pelvis and right foot pain. Patient states  Relevant Historical Information: ***  Additional pertinent review of systems negative.   Current Outpatient Medications:    acetaminophen (TYLENOL) 500 MG tablet, Take 1,000 mg by mouth every 6 (six) hours as needed for mild pain or fever., Disp: , Rfl:    ibuprofen (ADVIL) 200 MG tablet, Take 600 mg by mouth every 6 (six) hours as needed for fever or moderate pain., Disp: , Rfl:    mesalamine (CANASA) 1000 MG suppository, PLACE 1 SUPPOSITORY (1,000 MG TOTAL) RECTALLY AT BEDTIME., Disp: 90 suppository, Rfl: 0   mesalamine (LIALDA) 1.2 g EC tablet, Take 2 tablets (2.4 g total) by mouth daily with breakfast. (Patient not taking: Reported on 01/15/2022), Disp: 60 tablet, Rfl: 3   Multiple Vitamins-Minerals (MULTIVITAMIN WITH MINERALS) tablet, Take 1 tablet by mouth daily., Disp: , Rfl:    Omega-3 Fatty Acids (FISH OIL PO), Take 1 tablet by mouth daily., Disp: , Rfl:    predniSONE (DELTASONE) 20 MG tablet, Take 2 pills for 3 days, 1 pill for 4 days, Disp: 10 tablet, Rfl: 0   Objective:     There were no vitals filed for this visit.    There is no height or weight on file to calculate BMI.    Physical Exam:    ***   Electronically signed by:  Matthew Holt D.[facility] 1:22 PM 01/22/22

## 2022-01-22 NOTE — Telephone Encounter (Signed)
The pt called to report joint pain when taking Lialda.  He has not been taking it daily but only when he has a flare of his UC. He did have a flare and took Lialda for about 4 days before the joint pain began.  He stopped the lialda about a week ago.   He states that he saw his PCP and was put on prednisone and the pain has gotten some better but he still as left hip pain.  He is seeing Dr [name] tomorrow but wants to see if this is common with Lialda.  I did get him an appt with Dr Silverio Decamp for f/u in December to have it on hand.    Please advise any recommendations at this time.

## 2022-01-23 ENCOUNTER — Ambulatory Visit (INDEPENDENT_AMBULATORY_CARE_PROVIDER_SITE_OTHER): Payer: 59

## 2022-01-23 ENCOUNTER — Ambulatory Visit: Payer: 59 | Admitting: Sports Medicine

## 2022-01-23 VITALS — BP 124/82 | HR 68 | Ht 75.0 in | Wt 224.0 lb

## 2022-01-23 DIAGNOSIS — M2550 Pain in unspecified joint: Secondary | ICD-10-CM | POA: Diagnosis not present

## 2022-01-23 DIAGNOSIS — M25552 Pain in left hip: Secondary | ICD-10-CM

## 2022-01-23 DIAGNOSIS — M79671 Pain in right foot: Secondary | ICD-10-CM

## 2022-01-23 DIAGNOSIS — M1612 Unilateral primary osteoarthritis, left hip: Secondary | ICD-10-CM

## 2022-01-23 LAB — SEDIMENTATION RATE: Sed Rate: 15 mm/hr (ref 0–15)

## 2022-01-23 LAB — VITAMIN D 25 HYDROXY (VIT D DEFICIENCY, FRACTURES): VITD: 37.99 ng/mL (ref 30.00–100.00)

## 2022-01-23 LAB — CBC WITH DIFFERENTIAL/PLATELET
Basophils Absolute: 0.1 10*3/uL (ref 0.0–0.1)
Basophils Relative: 0.7 % (ref 0.0–3.0)
Eosinophils Absolute: 0.2 10*3/uL (ref 0.0–0.7)
Eosinophils Relative: 1.9 % (ref 0.0–5.0)
HCT: 44.3 % (ref 39.0–52.0)
Hemoglobin: 15 g/dL (ref 13.0–17.0)
Lymphocytes Relative: 15.9 % (ref 12.0–46.0)
Lymphs Abs: 1.4 10*3/uL (ref 0.7–4.0)
MCHC: 33.8 g/dL (ref 30.0–36.0)
MCV: 90.7 fl (ref 78.0–100.0)
Monocytes Absolute: 0.6 10*3/uL (ref 0.1–1.0)
Monocytes Relative: 7 % (ref 3.0–12.0)
Neutro Abs: 6.5 10*3/uL (ref 1.4–7.7)
Neutrophils Relative %: 74.5 % (ref 43.0–77.0)
Platelets: 245 10*3/uL (ref 150.0–400.0)
RBC: 4.88 Mil/uL (ref 4.22–5.81)
RDW: 12.8 % (ref 11.5–15.5)
WBC: 8.7 10*3/uL (ref 4.0–10.5)

## 2022-01-23 LAB — COMPREHENSIVE METABOLIC PANEL
ALT: 22 U/L (ref 0–53)
AST: 21 U/L (ref 0–37)
Albumin: 4.2 g/dL (ref 3.5–5.2)
Alkaline Phosphatase: 85 U/L (ref 39–117)
BUN: 15 mg/dL (ref 6–23)
CO2: 30 mEq/L (ref 19–32)
Calcium: 9.5 mg/dL (ref 8.4–10.5)
Chloride: 102 mEq/L (ref 96–112)
Creatinine, Ser: 0.98 mg/dL (ref 0.40–1.50)
GFR: 93.8 mL/min (ref >60.00)
Glucose, Bld: 82 mg/dL (ref 70–99)
Potassium: 3.9 mEq/L (ref 3.5–5.1)
Sodium: 139 mEq/L (ref 135–145)
Total Bilirubin: 0.7 mg/dL (ref 0.2–1.2)
Total Protein: 7.3 g/dL (ref 6.0–8.3)

## 2022-01-23 LAB — C-REACTIVE PROTEIN: CRP: <1 mg/dL (ref 0.5–20.0)

## 2022-01-23 LAB — URIC ACID: Uric Acid, Serum: 5.1 mg/dL (ref 4.0–7.8)

## 2022-01-23 LAB — TSH: TSH: 2.38 u[IU]/mL (ref 0.35–5.50)

## 2022-01-23 LAB — FERRITIN: Ferritin: 82.8 ng/mL (ref 22.0–322.0)

## 2022-01-23 MED ORDER — MELOXICAM 15 MG PO TABS: 15.0000 mg | ORAL_TABLET | Freq: Every day | ORAL | 0 refills | Status: DC

## 2022-01-23 NOTE — Patient Instructions (Addendum)
Good to see you Labs on the way out  - Start meloxicam 15 mg daily x2 weeks.  If still having pain after 2 weeks, complete 3rd-week of meloxicam. May use remaining meloxicam as needed once daily for pain control.  Do not to use additional NSAIDs while taking meloxicam.  May use Tylenol [id number] mg 2 to 3 times a day for breakthrough pain. Hip HEP 3 week follow up

## 2022-01-24 ENCOUNTER — Ambulatory Visit: Payer: 59 | Admitting: Physician Assistant

## 2022-01-25 LAB — ANTI-NUCLEAR AB-TITER (ANA TITER)
ANA TITER: 1:40 {titer} — ABNORMAL HIGH
ANA Titer 1: 1:40 {titer} — ABNORMAL HIGH

## 2022-01-25 LAB — CYCLIC CITRUL PEPTIDE ANTIBODY, IGG: Cyclic Citrullin Peptide Ab: <16 UNITS

## 2022-01-25 LAB — RHEUMATOID FACTOR: Rheumatoid fact SerPl-aCnc: <14 IU/mL (ref <14)

## 2022-01-25 LAB — ANA: Anti Nuclear Antibody (ANA): POSITIVE — AB

## 2022-02-12 NOTE — Progress Notes (Unsigned)
    Matthew Holt D.[place] Phone: [phone]   Assessment and Plan:     There are no diagnoses linked to this encounter.  ***   Pertinent previous records reviewed include ***   Follow Up: ***     Subjective:    I, Matthew Holt, am serving as a Education administrator for [name]   Chief Complaint: left hip, pelvis and right foot pain    HPI:    01/23/2022 Patient is a 46 year old male complaining of left hip , pelvis and right foot pain. Patient states thinks that the pain came from medication , left hip has been very painful no MOI, has been taking steroids the pain kind of subsided , hip is stll in pain,no numbness or tingling, if he sits for to long the hip is painful, the pain is waking him in sleep, a quick shot of pain when he has flares, pain that radiates to the groin and down the thigh , when he lifts his leg that when the pain starts    Right foot pain  that has subsided now , felt like a nail was going through the top of his foot , he was not tying his laces to tight     02/13/2022 Patient states   Relevant Historical Information: Ulcerative colitis    Additional pertinent review of systems negative.   Current Outpatient Medications:    acetaminophen (TYLENOL) 500 MG tablet, Take 1,000 mg by mouth every 6 (six) hours as needed for mild pain or fever., Disp: , Rfl:    ibuprofen (ADVIL) 200 MG tablet, Take 600 mg by mouth every 6 (six) hours as needed for fever or moderate pain., Disp: , Rfl:    meloxicam (MOBIC) 15 MG tablet, Take 1 tablet (15 mg total) by mouth daily., Disp: 30 tablet, Rfl: 0   mesalamine (CANASA) 1000 MG suppository, PLACE 1 SUPPOSITORY (1,000 MG TOTAL) RECTALLY AT BEDTIME., Disp: 90 suppository, Rfl: 0   mesalamine (LIALDA) 1.2 g EC tablet, Take 2 tablets (2.4 g total) by mouth daily with breakfast. (Patient not taking: Reported on 01/15/2022), Disp: 60 tablet, Rfl:  3   Multiple Vitamins-Minerals (MULTIVITAMIN WITH MINERALS) tablet, Take 1 tablet by mouth daily., Disp: , Rfl:    Omega-3 Fatty Acids (FISH OIL PO), Take 1 tablet by mouth daily., Disp: , Rfl:    predniSONE (DELTASONE) 20 MG tablet, Take 2 pills for 3 days, 1 pill for 4 days, Disp: 10 tablet, Rfl: 0   Objective:     There were no vitals filed for this visit.    There is no height or weight on file to calculate BMI.    Physical Exam:    ***   Electronically signed by:  Matthew Holt D.[facility] 8:39 AM 02/12/22

## 2022-02-13 ENCOUNTER — Ambulatory Visit (INDEPENDENT_AMBULATORY_CARE_PROVIDER_SITE_OTHER): Payer: 59 | Admitting: Sports Medicine

## 2022-02-13 DIAGNOSIS — M25552 Pain in left hip: Secondary | ICD-10-CM

## 2022-02-13 DIAGNOSIS — M79671 Pain in right foot: Secondary | ICD-10-CM | POA: Diagnosis not present

## 2022-02-13 DIAGNOSIS — M1612 Unilateral primary osteoarthritis, left hip: Secondary | ICD-10-CM

## 2022-03-04 ENCOUNTER — Ambulatory Visit (INDEPENDENT_AMBULATORY_CARE_PROVIDER_SITE_OTHER): Payer: 59

## 2022-03-04 ENCOUNTER — Ambulatory Visit: Payer: Self-pay

## 2022-03-04 ENCOUNTER — Ambulatory Visit: Payer: 59 | Admitting: Sports Medicine

## 2022-03-04 VITALS — BP 126/80 | HR 63 | Ht 75.0 in | Wt 226.0 lb

## 2022-03-04 DIAGNOSIS — G8929 Other chronic pain: Secondary | ICD-10-CM | POA: Diagnosis not present

## 2022-03-04 DIAGNOSIS — M25511 Pain in right shoulder: Secondary | ICD-10-CM | POA: Diagnosis not present

## 2022-03-04 NOTE — Patient Instructions (Addendum)
Good to see you Shoulder HEP  Pt referral  3-4 week follow up

## 2022-03-04 NOTE — Progress Notes (Signed)
Matthew Holt Matthew Holt Phone: [phone]   Assessment and Plan:     1. Chronic right shoulder pain -Chronic with exacerbation, initial sports medicine visit - Most consistent with rotator cuff tendinosis that is had no improvement with relative rest, activity modifications, course of NSAIDs - X-ray obtained in clinic.  My interpretation: No acute fracture or dislocation.  Mild cortical degenerative changes of the glenoid and humerus.  Mildly high riding humerus - Patient elected for subacromial CSI.  Tolerated well per note below - Recommend starting HEP and physical therapy for rotator cuff - DG Shoulder Right; Future - [place] LIMITED JOINT SPACE STRUCTURES UP BILAT(NO LINKED CHARGES)   Procedure: Subacromial Injection Side: Right  Risks explained and consent was given verbally. The site was cleaned with alcohol prep. A steroid injection was performed from posterior approach using 53m of 1% lidocaine without epinephrine and 186mof kenalog '40mg'$ /ml. This was well tolerated and resulted in symptomatic relief.  Needle was removed, hemostasis achieved, and post injection instructions were explained.   Pt was advised to call or return to clinic if these symptoms worsen or fail to improve as anticipated.    Sports Medicine: Musculoskeletal Ultrasound.   Exam:Right Complete Shoulder Exam.  Diagnosis: Chronic right shoulder pain  Biceps tendon: Normal Subscapularis: Thin with hyperechoic stranding Supraspinatus: Thin with moderate hyperechoic stranding throughout tendon Infraspinatus/teres minor: Mildly thinned Glenohumeral joint (posterior): Normal Acromioclavicular joint: Normal Additional findings: None    Impression:  Rotator cuff tendinosis, most significant in supraspinatus   Pertinent previous records reviewed include none   Follow Up: 3 to 4 weeks for reevaluation.  If no improvement or worsening of  symptoms, could consider MRI   Subjective:   I, Matthew Holt, am serving as a scEducation administratoror Doctor BeGlennon MacChief Complaint: right shoulder pain   HPI:   03/04/22 Patient is a [age]ear old male complaining of right shoulder pain. Patient states has been manageable for some time has been seeing chiro feels like its getting worst , does do boxing classes, he isnt able to lift , lateral raises hurts , does radiate down to the bicep , no numbness tingling, no meds for the pain, decreased ROM due to pain,   Relevant Historical Information: Ulcerative colitis  Additional pertinent review of systems negative.   Current Outpatient Medications:    acetaminophen (TYLENOL) 500 MG tablet, Take 1,000 mg by mouth every 6 (six) hours as needed for mild pain or fever., Disp: , Rfl:    ibuprofen (ADVIL) 200 MG tablet, Take 600 mg by mouth every 6 (six) hours as needed for fever or moderate pain., Disp: , Rfl:    meloxicam (MOBIC) 15 MG tablet, Take 1 tablet (15 mg total) by mouth daily., Disp: 30 tablet, Rfl: 0   mesalamine (CANASA) 1000 MG suppository, PLACE 1 SUPPOSITORY (1,000 MG TOTAL) RECTALLY AT BEDTIME., Disp: 90 suppository, Rfl: 0   mesalamine (LIALDA) 1.2 g EC tablet, Take 2 tablets (2.4 g total) by mouth daily with breakfast., Disp: 60 tablet, Rfl: 3   Multiple Vitamins-Minerals (MULTIVITAMIN WITH MINERALS) tablet, Take 1 tablet by mouth daily., Disp: , Rfl:    Omega-3 Fatty Acids (FISH OIL PO), Take 1 tablet by mouth daily., Disp: , Rfl:    predniSONE (DELTASONE) 20 MG tablet, Take 2 pills for 3 days, 1 pill for 4 days, Disp: 10 tablet, Rfl: 0   Objective:     Vitals:  03/04/22 1437  BP: 126/80  Pulse: 63  SpO2: 98%  Weight: 226 lb (102.5 kg)  Height: '6\' 3"'$  (1.905 m)      Body mass index is 28.25 kg/m.    Physical Exam:    Gen: Appears well, nad, nontoxic and pleasant Neuro:sensation intact, strength is 5/5 with df/pf/inv/ev, muscle tone wnl Skin: no suspicious lesion  or defmority Psych: A&O, appropriate mood and affect  Right shoulder: no deformity, swelling or muscle wasting No scapular winging   FF 180, abd 180, int 0, ext 90 NTTP over the [place], clavicle, ac, coracoid, biceps groove, humerus, deltoid, trapezius, cervical spine Positive Neer, Hawking's, empty can, O'Brien Neg   subscap liftoff, speeds,  , crossarm Neg ant drawer, sulcus sign, apprehension Negative Spurling's test bilat FROM of neck    Electronically signed by:  Matthew Holt D.[facility] 4:28 PM 03/04/22

## 2022-03-07 ENCOUNTER — Ambulatory Visit: Payer: 59 | Attending: Sports Medicine

## 2022-03-07 DIAGNOSIS — M6281 Muscle weakness (generalized): Secondary | ICD-10-CM | POA: Diagnosis present

## 2022-03-07 DIAGNOSIS — M25511 Pain in right shoulder: Secondary | ICD-10-CM | POA: Insufficient documentation

## 2022-03-07 DIAGNOSIS — G8929 Other chronic pain: Secondary | ICD-10-CM | POA: Insufficient documentation

## 2022-03-07 NOTE — Therapy (Signed)
OUTPATIENT PHYSICAL THERAPY SHOULDER EVALUATION   Patient Name: Matthew Holt MRN: [id number] DOB:1975/09/28, 46 y.o., male Today's Date: 03/08/2022  END OF SESSION:  PT End of Session - 03/08/22 0824     Visit Number 1    Number of Visits 8    Date for PT Re-Evaluation 05/03/22    Authorization Type [employer]    PT Start Time 3825    PT Stop Time 1745    PT Time Calculation (min) 35 min    Activity Tolerance Patient tolerated treatment well    Behavior During Therapy [facility] for tasks assessed/performed             Past Medical History:  Diagnosis Date   Asthma    Colitis    Dyslipidemia 09/05/2020   Ulcerative colitis with rectal bleeding [facility])    Past Surgical History:  Procedure Laterality Date   ANKLE FRACTURE SURGERY     CLAVICLE SURGERY     COLONOSCOPY     WISDOM TOOTH EXTRACTION     Patient Active Problem List   Diagnosis Date Noted   Dyslipidemia 09/05/2020   Ulcerative colitis with rectal bleeding ([place])     PCP:  Vivi Barrack, MD  REFERRING PROVIDER: [name], DO  REFERRING DIAG: [phone] (ICD-10-CM) - Chronic right shoulder pain   THERAPY DIAG:  Chronic right shoulder pain - Plan: PT plan of care cert/re-cert  Muscle weakness (generalized) - Plan: PT plan of care cert/re-cert  Rationale for Evaluation and Treatment: Rehabilitation  ONSET DATE: Chronic  SUBJECTIVE:                                                                                                                                                                                      SUBJECTIVE STATEMENT: Pt is a pleasant 46 y/o M who presents with c/o chronic R shoulder pain. Notes that pain is mainly when in abducted position, especially when throwing a hook with R arm when boxing. Denies N/T down R UE, most pain is present in anterior R shoulder.   PERTINENT HISTORY: None  PAIN:  Are you having pain?  Yes: NPRS scale: 2/10 Worst: 7/10 at worst Pain location: R  anterior shoulder Pain description: sharp  Aggravating factors: lifting weights,  Relieving factors: chiropractic, rest  PRECAUTIONS: None  WEIGHT BEARING RESTRICTIONS: No  FALLS:  Has patient fallen in last 6 months? No  LIVING ENVIRONMENT: Lives with: lives with their family and lives with their spouse Lives in: House/apartment  OCCUPATION: Consultant work   PLOF: Independent and Independent with basic ADLs  PATIENT GOALS:decrease shoulder pain when working out with [employer] and boxing  OBJECTIVE:  DIAGNOSTIC FINDINGS:  See imaging  PATIENT SURVEYS:  FOTO: 67% function; 79% function  COGNITION: Overall cognitive status: Within functional limits for tasks assessed     SENSATION: WFL  POSTURE: Rounded shoulders  UPPER EXTREMITY ROM:   Active ROM Right eval Left eval  Shoulder flexion WNL WNL  Shoulder extension WNL WNL  Shoulder abduction WNL WNL  Shoulder adduction WNL WNL  Shoulder internal rotation 48 p! WNL  Shoulder external rotation WNL WNL  Elbow flexion    Elbow extension    Wrist flexion    Wrist extension    Wrist ulnar deviation    Wrist radial deviation    Wrist pronation    Wrist supination    (Blank rows = not tested)  UPPER EXTREMITY MMT:  MMT Right eval Left eval  Shoulder flexion WNL WNL  Shoulder extension    Shoulder abduction WNL WNL  Shoulder adduction WNL WNL  Shoulder internal rotation WNL WNL  Shoulder external rotation WNL WNL  Middle trapezius    Lower trapezius    Elbow flexion    Elbow extension    Wrist flexion    Wrist extension    Wrist ulnar deviation    Wrist radial deviation    Wrist pronation    Wrist supination    Grip strength (lbs)    (Blank rows = not tested)  SHOULDER SPECIAL TESTS: Impingement tests: Hawkins/Kennedy impingement test: positive  and Painful arc test: positive  SLAP lesions: DNT Instability tests: DNT Rotator cuff assessment: Empty can test: positive  and Full can test:  negative Biceps assessment: DNT  JOINT MOBILITY TESTING:  WFL  PALPATION:  TTP to R subscapularis   TREATMENT: [facility] Adult PT Treatment:                                                DATE: 03/07/2022 Therapeutic Exercise: Corner stretch x 30"  IR towel stretch x 30" R Trigger Point Dry Needling Treatment: Pre-treatment instruction: Patient instructed on dry needling rationale, procedures, and possible side effects including pain during treatment (achy,cramping feeling), bruising, drop of blood, lightheadedness, nausea, sweating. Patient Consent Given: Yes Education handout provided: Yes Muscles treated: R subscapularis, R rhomboid major, R anterior deltoid  Needle size and number: .30x27m x 2 and .30x371mx 1 Electrical stimulation performed: No Parameters: N/A Treatment response/outcome: Twitch response elicited and Palpable decrease in muscle tension Post-treatment instructions: Patient instructed to expect possible mild to moderate muscle soreness later today and/or tomorrow. Patient instructed in methods to reduce muscle soreness and to continue prescribed HEP. If patient was dry needled over the lung field, patient was instructed on signs and symptoms of pneumothorax and, however unlikely, to see immediate medical attention should they occur. Patient was also educated on signs and symptoms of infection and to seek medical attention should they occur. Patient verbalized understanding of these instructions and education.   PATIENT EDUCATION: Education details: eval findings, FOTO, HEP, POC Person educated: Patient Education method: Explanation, Demonstration, and Handouts Education comprehension: verbalized understanding and returned demonstration  HOME EXERCISE PROGRAM: Access Code: [email or web address] URL: [email or web address] Date: 03/08/2022 Prepared by: DaOctavio MannsExercises - Corner Pec Major Stretch  - 2 x daily - 7 x weekly - 2 sets - 30 sec hold - Standing  Shoulder Internal Rotation Stretch with Towel  - 2 x daily - 7 x  weekly - 2 reps - 30 sec hold  ASSESSMENT:  CLINICAL IMPRESSION: Patient is a 46 y.o. M who was seen today for physical therapy evaluation and treatment for chronic R shoulder pain. Physical findings conisist with physician impression as pt has decreased R shoulder IR and pain with elevation. His FOTO demonstrates decrease in functional ability below PLOF. He would benefit from skilled PT working on manual therapy for decreasing pain and strengthening of dynamic stabilizers.    OBJECTIVE IMPAIRMENTS: decreased ROM, impaired UE functional use, and pain.   ACTIVITY LIMITATIONS: carrying, lifting, and reach over head  PARTICIPATION LIMITATIONS: community activity and occupation  PERSONAL FACTORS: Time since onset of injury/illness/exacerbation are also affecting patient's functional outcome.   REHAB POTENTIAL: Excellent  CLINICAL DECISION MAKING: Stable/uncomplicated  EVALUATION COMPLEXITY: Low   GOALS: Goals reviewed with patient? No  SHORT TERM GOALS: Target date: 03/29/2022   Pt will be compliant and knowledgeable with initial HEP for improved comfort and carryover Baseline: initial HEP given  Goal status: INITIAL  2.  Pt will self report right shoulder pain no greater than 3/10 for improved comfort and functional ability Baseline: 7/10 at worst Goal status: INITIAL   LONG TERM GOALS: Target date: 05/03/2022   Pt will self report right shoulder pain no greater than 0/10 for improved comfort and functional ability Baseline: 7/10 at worst Goal status: INITIAL  2.  Pt will improve FOTO function score to no less than 79% as proxy for functional improvement Baseline: 67% function Goal status: INITIAL   3.  Pt will improve right shoulder IR to no less than 75 deg for improved functional ability with overhead reaching Baseline: 48 with pain Goal status: INITIAL  4.  Pt will be compliant and knowledgeable with  advanced HEP for improved carryover post discharge from therapy Baseline: initial HEP given  Goal status: INITIAL   PLAN:  PT FREQUENCY: 1x/week  PT DURATION: 8 weeks  PLANNED INTERVENTIONS: Therapeutic exercises, Therapeutic activity, Neuromuscular re-education, Balance training, Gait training, Patient/Family education, Self Care, Joint mobilization, Dry Needling, Electrical stimulation, Cryotherapy, Moist heat, Vasopneumatic device, Manual therapy, and Re-evaluation  PLAN FOR NEXT SESSION: assess response to TPDN and HEP, progress as able   Ward Chatters, PT 03/08/2022, 8:39 AM

## 2022-03-12 IMAGING — CT CT CARDIAC CORONARY ARTERY CALCIUM SCORE
3 series · 14 of 20 positions shown, 15 images · non-contrast
Comparison: 06/30/2019 chest radiograph
COMPARISON: 06/30/2019 chest radiograph

Addendum:
EXAM:
OVER-READ INTERPRETATION  CT CHEST

The following report is an over-read performed by radiologist Dr.
Karely Hammoud [REDACTED] on 10/13/2020. This over-read
does not include interpretation of cardiac or coronary anatomy or
pathology. The calcium score interpretation by the cardiologist is
attached.
CLINICAL DATA: Cardiovascular Disease Risk stratification
Coronary Calcium Score
TECHNIQUE: A gated, non-contrast computed tomography scan of the heart was
performed using 3mm slice thickness. Axial images were analyzed on a
dedicated workstation. Calcium scoring of the coronary arteries was
performed using the Agatston method.

[Series 2: casc 3.0 bv41 2 bestdiast 69 % · axial · 0.47mm/px · z∈[+1332,+1420]mm · 4 of 49 slices shown, 5 images]
[im 10/49  vessel]
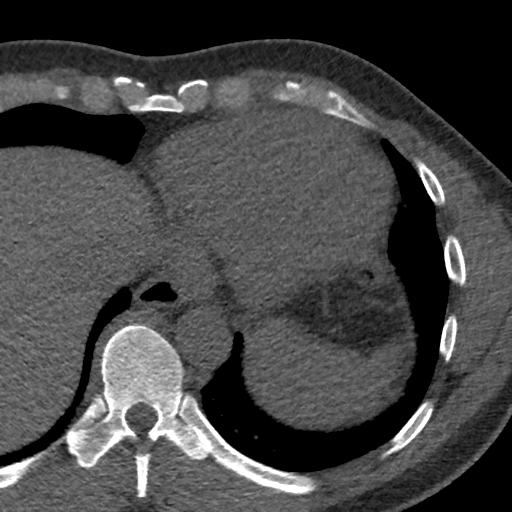
[im 10/49  lung]
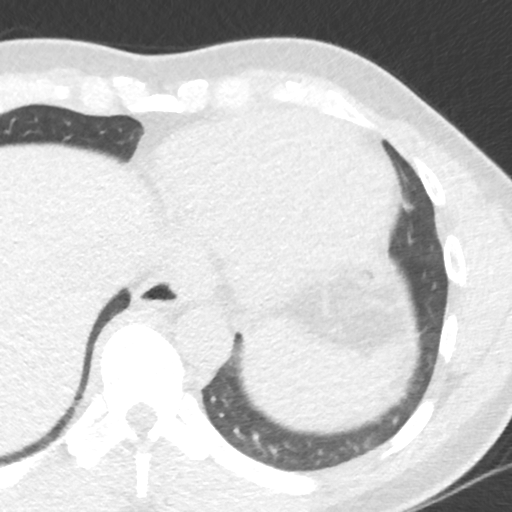
[im 20/49  vessel]
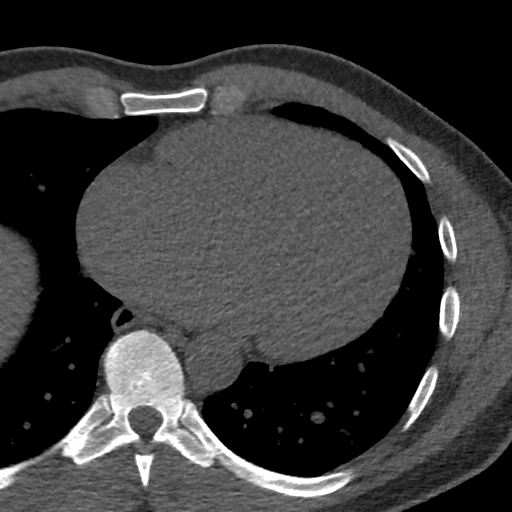
[im 29/49  vessel]
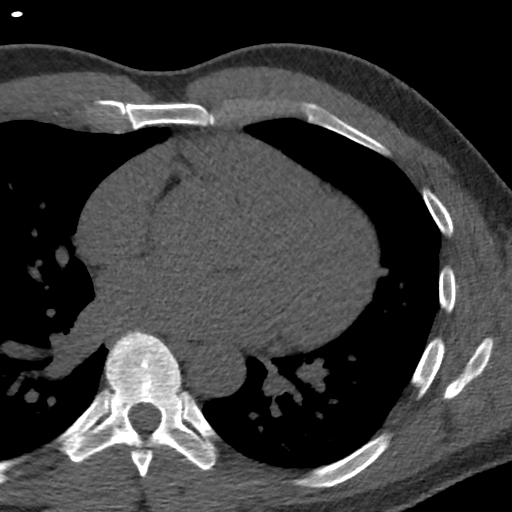
[im 39/49  vessel]
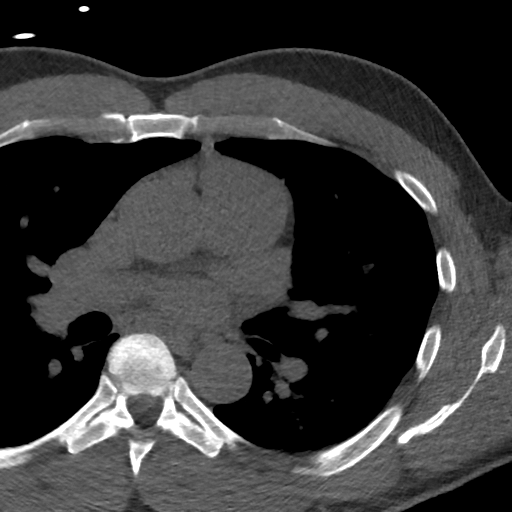

[Series 3: lung 69 % · axial · 0.65mm/px · z∈[+1330,+1426]mm · 5 of 49 slices shown]
[im 9/49  lung]
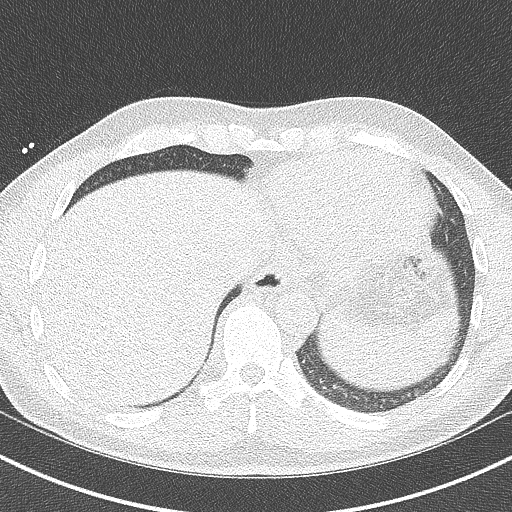
[im 17/49  lung]
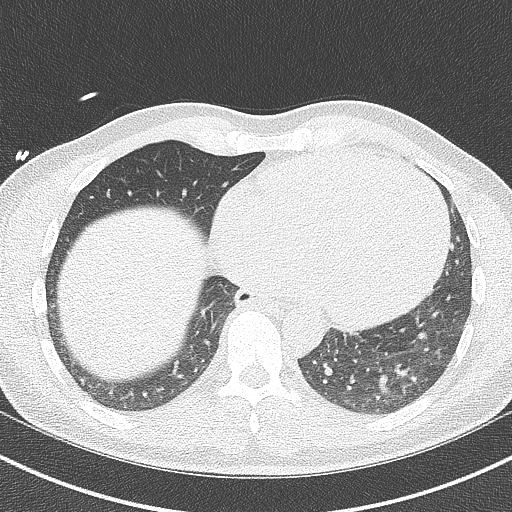
[im 25/49  lung]
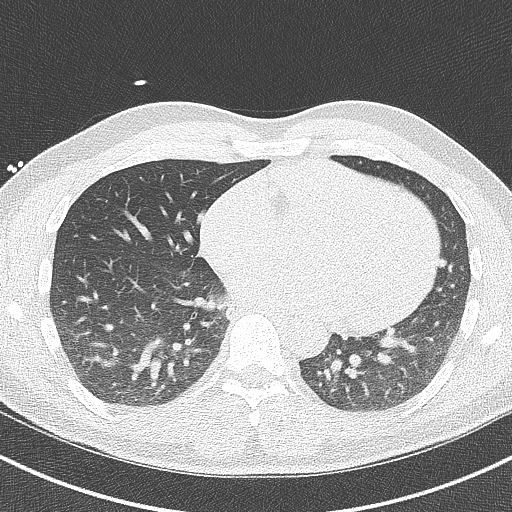
[im 33/49  lung]
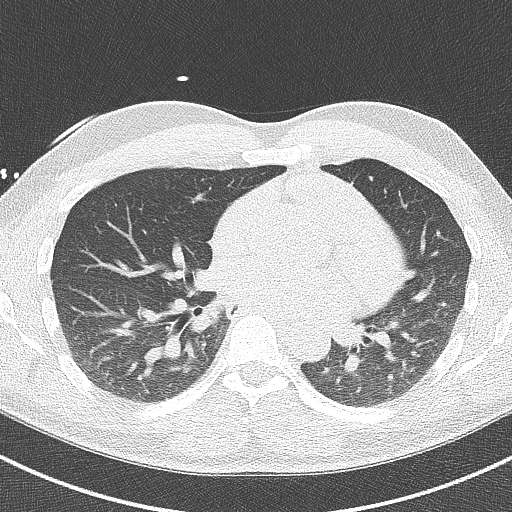
[im 41/49  lung]
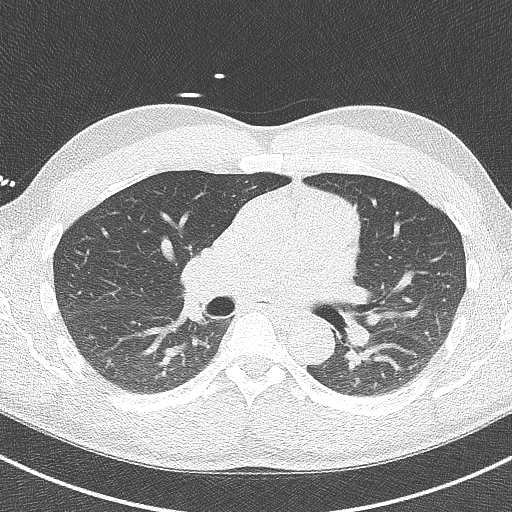

[Series 4: lung st 69 % · axial · 0.65mm/px · z∈[+1330,+1426]mm · 5 of 49 slices shown]
[im 9/49  lung]
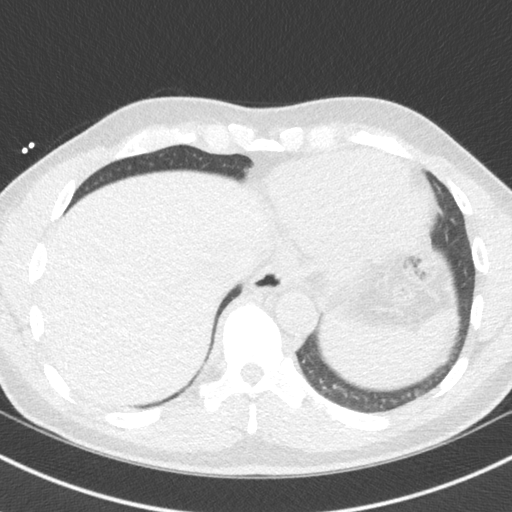
[im 17/49  lung]
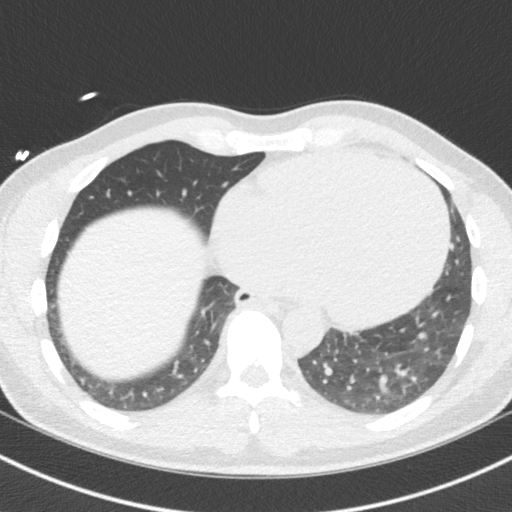
[im 25/49  lung]
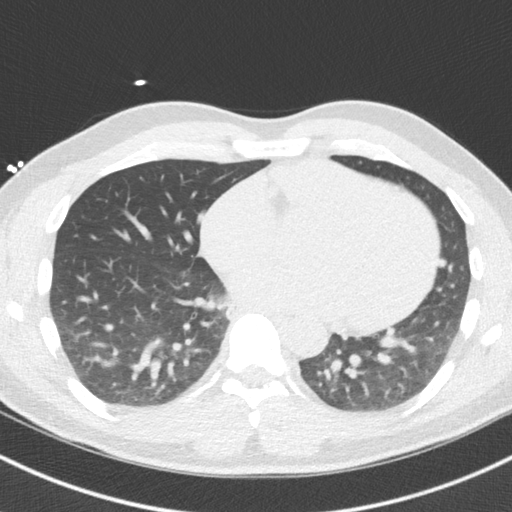
[im 33/49  lung]
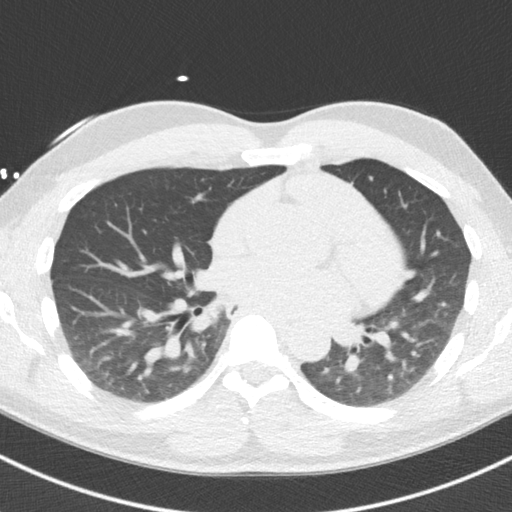
[im 41/49  lung]
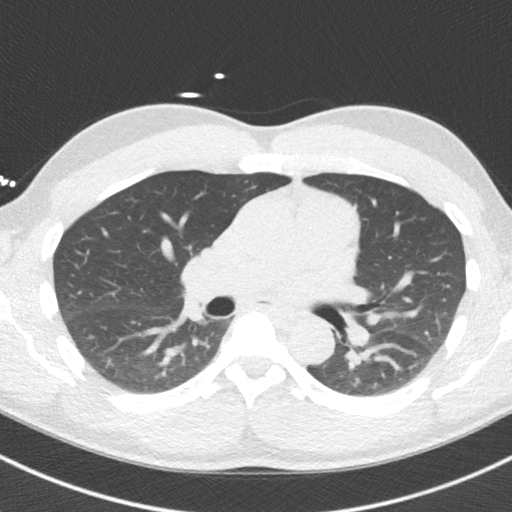

[14 of 20 positions shown; findings below may reference images not displayed]

FINDINGS: Vascular: Normal aortic caliber.

Mediastinum/Nodes: No imaged thoracic adenopathy.

Lungs/Pleura: No pleural fluid. Subpleural lymph node along the
right minor fissure of 4 mm.

Upper Abdomen: Normal imaged portions of the liver, spleen, stomach.

Musculoskeletal: No acute osseous abnormality.
IMPRESSION: No acute findings in the imaged extracardiac chest.
FINDINGS: Coronary arteries: Normal origins.

Coronary Calcium Score:

Left main: 0

Left anterior descending artery: 0

Left circumflex artery: 0

Right coronary artery: 0

Total: 0

Percentile: 0

Pericardium: Normal.

Ascending Aorta: Normal caliber.

Non-cardiac: See separate report from [REDACTED].
IMPRESSION: Coronary calcium score of 0. This is a low risk study.



If CAC=0, it is reasonable to withhold statin therapy and reassess
in 5 to 10 years, as long as higher risk conditions are absent
(diabetes mellitus, family history of premature CHD in first degree
relatives (males <55 years; females <65 years), cigarette smoking,
or LDL >=190 mg/dL).

If CAC is 1 to 99, it is reasonable to initiate statin therapy for
patients >=55 years of age.

If CAC is >=100 or >=75th percentile, it is reasonable to initiate
statin therapy at any age.

Cardiology referral should be considered for patients with CAC
scores >=400 or >=75th percentile.

*9791 AHA/ACC/AACVPR/AAPA/ABC/PRADA UJIMORI/COLSTON/SABILLON/Arijanit/ADDI/PAULUS N/GLADIUC
Guideline on the Management of Blood Cholesterol: A Report of the
American College of Cardiology/American Heart Association Task Force
on Clinical Practice Guidelines. J Am Coll Cardiol.
5861;73(24):6531-6114.

*** End of Addendum ***
EXAM:
OVER-READ INTERPRETATION  CT CHEST

The following report is an over-read performed by radiologist Dr.
Karely Hammoud [REDACTED] on 10/13/2020. This over-read
does not include interpretation of cardiac or coronary anatomy or
pathology. The calcium score interpretation by the cardiologist is
attached.
FINDINGS: Vascular: Normal aortic caliber.

Mediastinum/Nodes: No imaged thoracic adenopathy.

Lungs/Pleura: No pleural fluid. Subpleural lymph node along the
right minor fissure of 4 mm.

Upper Abdomen: Normal imaged portions of the liver, spleen, stomach.

Musculoskeletal: No acute osseous abnormality.
IMPRESSION: No acute findings in the imaged extracardiac chest.

## 2022-03-18 ENCOUNTER — Ambulatory Visit: Payer: 59

## 2022-03-18 NOTE — Therapy (Incomplete)
OUTPATIENT PHYSICAL THERAPY TREATMENT NOTE   Patient Name: Matthew Holt MRN: [id number] DOB:1976-02-05, 46 y.o., male Today's Date: 03/18/2022  PCP: Vivi Barrack, MD  REFERRING PROVIDER: [name], DO   END OF SESSION:    Past Medical History:  Diagnosis Date   Asthma    Colitis    Dyslipidemia 09/05/2020   Ulcerative colitis with rectal bleeding [facility])    Past Surgical History:  Procedure Laterality Date   ANKLE FRACTURE SURGERY     CLAVICLE SURGERY     COLONOSCOPY     WISDOM TOOTH EXTRACTION     Patient Active Problem List   Diagnosis Date Noted   Dyslipidemia 09/05/2020   Ulcerative colitis with rectal bleeding ([place])     REFERRING DIAG: M25.511,G89.29 (ICD-10-CM) - Chronic right shoulder pain    THERAPY DIAG:  No diagnosis found.  Rationale for Evaluation and Treatment Rehabilitation  PERTINENT HISTORY: None   PRECAUTIONS: None   SUBJECTIVE:                                                                                                                                                                                      SUBJECTIVE STATEMENT:  ***   PAIN:  Are you having pain?  Yes: NPRS scale: 2/10 Worst: 7/10 at worst Pain location: R anterior shoulder Pain description: sharp  Aggravating factors: lifting weights,  Relieving factors: chiropractic, rest   OBJECTIVE: (objective measures completed at initial evaluation unless otherwise dated)  DIAGNOSTIC FINDINGS:  See imaging   PATIENT SURVEYS:  FOTO: 67% function; 79% function   COGNITION: Overall cognitive status: Within functional limits for tasks assessed                                  SENSATION: WFL   POSTURE: Rounded shoulders   UPPER EXTREMITY ROM:    Active ROM Right eval Left eval  Shoulder flexion WNL WNL  Shoulder extension WNL WNL  Shoulder abduction WNL WNL  Shoulder adduction WNL WNL  Shoulder internal rotation 48 p! WNL  Shoulder external rotation WNL  WNL  Elbow flexion      Elbow extension      Wrist flexion      Wrist extension      Wrist ulnar deviation      Wrist radial deviation      Wrist pronation      Wrist supination      (Blank rows = not tested)   UPPER EXTREMITY MMT:   MMT Right eval Left eval  Shoulder flexion WNL WNL  Shoulder extension  Shoulder abduction WNL WNL  Shoulder adduction WNL WNL  Shoulder internal rotation WNL WNL  Shoulder external rotation WNL WNL  Middle trapezius      Lower trapezius      Elbow flexion      Elbow extension      Wrist flexion      Wrist extension      Wrist ulnar deviation      Wrist radial deviation      Wrist pronation      Wrist supination      Grip strength (lbs)      (Blank rows = not tested)   SHOULDER SPECIAL TESTS: Impingement tests: Hawkins/Kennedy impingement test: positive  and Painful arc test: positive  SLAP lesions: DNT Instability tests: DNT Rotator cuff assessment: Empty can test: positive  and Full can test: negative Biceps assessment: DNT   JOINT MOBILITY TESTING:  WFL   PALPATION:  TTP to R subscapularis             TREATMENT: [facility] Adult PT Treatment:                                                DATE: 03/18/2022 Therapeutic Exercise: Corner stretch x 30"  IR towel stretch x 30" R Trigger Point Dry Needling Treatment: Pre-treatment instruction: Patient instructed on dry needling rationale, procedures, and possible side effects including pain during treatment (achy,cramping feeling), bruising, drop of blood, lightheadedness, nausea, sweating. Patient Consent Given: Yes Education handout provided: Yes Muscles treated: R subscapularis, R rhomboid major, R anterior deltoid  Needle size and number: .30x39m x 2 and .30x322mx 1 Electrical stimulation performed: No Parameters: N/A Treatment response/outcome: Twitch response elicited and Palpable decrease in muscle tension Post-treatment instructions: Patient instructed to expect possible  mild to moderate muscle soreness later today and/or tomorrow. Patient instructed in methods to reduce muscle soreness and to continue prescribed HEP. If patient was dry needled over the lung field, patient was instructed on signs and symptoms of pneumothorax and, however unlikely, to see immediate medical attention should they occur. Patient was also educated on signs and symptoms of infection and to seek medical attention should they occur. Patient verbalized understanding of these instructions and education.   [facility] PT Treatment:                                                DATE: 03/07/2022 Therapeutic Exercise: Corner stretch x 30"  IR towel stretch x 30" R Trigger Point Dry Needling Treatment: Pre-treatment instruction: Patient instructed on dry needling rationale, procedures, and possible side effects including pain during treatment (achy,cramping feeling), bruising, drop of blood, lightheadedness, nausea, sweating. Patient Consent Given: Yes Education handout provided: Yes Muscles treated: R subscapularis, R rhomboid major, R anterior deltoid  Needle size and number: .30x5085m 2 and .30x30m64m1 Electrical stimulation performed: No Parameters: N/A Treatment response/outcome: Twitch response elicited and Palpable decrease in muscle tension Post-treatment instructions: Patient instructed to expect possible mild to moderate muscle soreness later today and/or tomorrow. Patient instructed in methods to reduce muscle soreness and to continue prescribed HEP. If patient was dry needled over the lung field, patient was instructed on signs and symptoms of pneumothorax and, however unlikely,  to see immediate medical attention should they occur. Patient was also educated on signs and symptoms of infection and to seek medical attention should they occur. Patient verbalized understanding of these instructions and education.    PATIENT EDUCATION: Education details: eval findings, FOTO, HEP,  POC Person educated: Patient Education method: Explanation, Demonstration, and Handouts Education comprehension: verbalized understanding and returned demonstration   HOME EXERCISE PROGRAM: Access Code: [email or web address] URL: [email or web address] Date: 03/08/2022 Prepared by: Octavio Manns   Exercises - Corner Pec Major Stretch  - 2 x daily - 7 x weekly - 2 sets - 30 sec hold - Standing Shoulder Internal Rotation Stretch with Towel  - 2 x daily - 7 x weekly - 2 reps - 30 sec hold   ASSESSMENT:   CLINICAL IMPRESSION: ***    OBJECTIVE IMPAIRMENTS: decreased ROM, impaired UE functional use, and pain.    ACTIVITY LIMITATIONS: carrying, lifting, and reach over head   PARTICIPATION LIMITATIONS: community activity and occupation   PERSONAL FACTORS: Time since onset of injury/illness/exacerbation are also affecting patient's functional outcome.    REHAB POTENTIAL: Excellent   CLINICAL DECISION MAKING: Stable/uncomplicated   EVALUATION COMPLEXITY: Low     GOALS: Goals reviewed with patient? No   SHORT TERM GOALS: Target date: 03/29/2022   Pt will be compliant and knowledgeable with initial HEP for improved comfort and carryover Baseline: initial HEP given  Goal status: INITIAL   2.  Pt will self report right shoulder pain no greater than 3/10 for improved comfort and functional ability Baseline: 7/10 at worst Goal status: INITIAL    LONG TERM GOALS: Target date: 05/03/2022   Pt will self report right shoulder pain no greater than 0/10 for improved comfort and functional ability Baseline: 7/10 at worst Goal status: INITIAL   2.  Pt will improve FOTO function score to no less than 79% as proxy for functional improvement Baseline: 67% function Goal status: INITIAL    3.  Pt will improve right shoulder IR to no less than 75 deg for improved functional ability with overhead reaching Baseline: 48 with pain Goal status: INITIAL   4.  Pt will be compliant and  knowledgeable with advanced HEP for improved carryover post discharge from therapy Baseline: initial HEP given  Goal status: INITIAL     PLAN:   PT FREQUENCY: 1x/week   PT DURATION: 8 weeks   PLANNED INTERVENTIONS: Therapeutic exercises, Therapeutic activity, Neuromuscular re-education, Balance training, Gait training, Patient/Family education, Self Care, Joint mobilization, Dry Needling, Electrical stimulation, Cryotherapy, Moist heat, Vasopneumatic device, Manual therapy, and Re-evaluation   PLAN FOR NEXT SESSION: assess response to TPDN and HEP, progress as able   Ward Chatters, PT 03/18/2022, 8:02 AM

## 2022-04-01 ENCOUNTER — Ambulatory Visit: Payer: 59 | Attending: Sports Medicine

## 2022-04-01 ENCOUNTER — Ambulatory Visit: Payer: 59 | Admitting: Gastroenterology

## 2022-04-01 DIAGNOSIS — M6281 Muscle weakness (generalized): Secondary | ICD-10-CM | POA: Insufficient documentation

## 2022-04-01 DIAGNOSIS — M25511 Pain in right shoulder: Secondary | ICD-10-CM | POA: Diagnosis present

## 2022-04-01 DIAGNOSIS — G8929 Other chronic pain: Secondary | ICD-10-CM | POA: Diagnosis present

## 2022-04-01 NOTE — Therapy (Signed)
OUTPATIENT PHYSICAL THERAPY TREATMENT NOTE   Patient Name: Matthew Holt MRN: [id number] DOB:[birth year], 46 y.o., male Today's Date: 04/02/2022  PCP: Vivi Barrack, MD  REFERRING PROVIDER: [name], DO   END OF SESSION:   PT End of Session - 04/01/22 1614     Visit Number 2    Number of Visits 8    Date for PT Re-Evaluation 05/03/22    Authorization Type [employer]    PT Start Time 1615    PT Stop Time 1655    PT Time Calculation (min) 40 min    Activity Tolerance Patient tolerated treatment well    Behavior During Therapy WFL for tasks assessed/performed             Past Medical History:  Diagnosis Date   Asthma    Colitis    Dyslipidemia 09/05/2020   Ulcerative colitis with rectal bleeding ([place])    Past Surgical History:  Procedure Laterality Date   ANKLE FRACTURE SURGERY     CLAVICLE SURGERY     COLONOSCOPY     WISDOM TOOTH EXTRACTION     Patient Active Problem List   Diagnosis Date Noted   Dyslipidemia 09/05/2020   Ulcerative colitis with rectal bleeding ([place])     REFERRING DIAG: M25.511,G89.29 (ICD-10-CM) - Chronic right shoulder pain    THERAPY DIAG:  Chronic right shoulder pain  Muscle weakness (generalized)  Rationale for Evaluation and Treatment Rehabilitation  PERTINENT HISTORY: None   PRECAUTIONS: None   SUBJECTIVE:                                                                                                                                                                                      SUBJECTIVE STATEMENT:  Pt presents to PT with reports of continued pain and discomfort in R shoulder. Has been compliant with HEP with no adverse effect. Pt is ready to begin PT at this time.    PAIN:  Are you having pain?  Yes: NPRS scale: 2/10 Worst: 7/10 at worst Pain location: R anterior shoulder Pain description: sharp  Aggravating factors: lifting weights,  Relieving factors: chiropractic, rest   OBJECTIVE: (objective measures  completed at initial evaluation unless otherwise dated)  DIAGNOSTIC FINDINGS:  See imaging   PATIENT SURVEYS:  FOTO: 67% function; 79% function   COGNITION: Overall cognitive status: Within functional limits for tasks assessed                                  SENSATION: WFL   POSTURE: Rounded shoulders   UPPER EXTREMITY ROM:    Active  ROM Right eval Left eval  Shoulder flexion WNL WNL  Shoulder extension WNL WNL  Shoulder abduction WNL WNL  Shoulder adduction WNL WNL  Shoulder internal rotation 48 p! WNL  Shoulder external rotation WNL WNL  Elbow flexion      Elbow extension      Wrist flexion      Wrist extension      Wrist ulnar deviation      Wrist radial deviation      Wrist pronation      Wrist supination      (Blank rows = not tested)   UPPER EXTREMITY MMT:   MMT Right eval Left eval  Shoulder flexion WNL WNL  Shoulder extension      Shoulder abduction WNL WNL  Shoulder adduction WNL WNL  Shoulder internal rotation WNL WNL  Shoulder external rotation WNL WNL  Middle trapezius      Lower trapezius      Elbow flexion      Elbow extension      Wrist flexion      Wrist extension      Wrist ulnar deviation      Wrist radial deviation      Wrist pronation      Wrist supination      Grip strength (lbs)      (Blank rows = not tested)   SHOULDER SPECIAL TESTS: Impingement tests: Hawkins/Kennedy impingement test: positive  and Painful arc test: positive  SLAP lesions: DNT Instability tests: DNT Rotator cuff assessment: Empty can test: positive  and Full can test: negative Biceps assessment: DNT   JOINT MOBILITY TESTING:  WFL   PALPATION:  TTP to R subscapularis             TREATMENT: [facility] Adult PT Treatment:                                                DATE: 04/01/2022 Therapeutic Exercise: UBE lvl 2.0 x 4 min while taking subjective Prone W 2x10 Prone Y 2x10 Prone T 2x10 Supine horizontal abd 2x15 BTB Manual Therapy: STM and trigger  point release to R bicep tendon Skilled palpation of trigger points during TPDN Trigger Point Dry Needling Treatment: Pre-treatment instruction: Patient instructed on dry needling rationale, procedures, and possible side effects including pain during treatment (achy,cramping feeling), bruising, drop of blood, lightheadedness, nausea, sweating. Patient Consent Given: Yes Education handout provided: Yes Muscles treated: R subscapularis, R rhomboid major, R anterior deltoid  Needle size and number: .30x41m x 2 and .30x373mx 1 Electrical stimulation performed: No Parameters: N/A Treatment response/outcome: Twitch response elicited and Palpable decrease in muscle tension Post-treatment instructions: Patient instructed to expect possible mild to moderate muscle soreness later today and/or tomorrow. Patient instructed in methods to reduce muscle soreness and to continue prescribed HEP. If patient was dry needled over the lung field, patient was instructed on signs and symptoms of pneumothorax and, however unlikely, to see immediate medical attention should they occur. Patient was also educated on signs and symptoms of infection and to seek medical attention should they occur. Patient verbalized understanding of these instructions and education.   [facility]  [facility]: [facility] Therapeutic Exercise: Corner stretch x 30"  IR towel stretch x 30" R Trigger Point Dry Needling Treatment: Pre-treatment instruction: Patient instructed on dry needling rationale, procedures, and possible side effects including pain during treatment (achy,cramping feeling), bruising, drop of blood, lightheadedness, nausea, sweating. Patient Consent Given: Yes Education handout provided: Yes Muscles treated: R subscapularis, R rhomboid major, R anterior deltoid  Needle size and number: .30x32m x 2 and .30x345mx 1 Electrical stimulation performed: No Parameters:  N/A Treatment response/outcome: Twitch response elicited and Palpable decrease in muscle tension Post-treatment instructions: Patient instructed to expect possible mild to moderate muscle soreness later today and/or tomorrow. Patient instructed in methods to reduce muscle soreness and to continue prescribed HEP. If patient was dry needled over the lung field, patient was instructed on signs and symptoms of pneumothorax and, however unlikely, to see immediate medical attention should they occur. Patient was also educated on signs and symptoms of infection and to seek medical attention should they occur. Patient verbalized understanding of these instructions and education.    PATIENT EDUCATION: Education details: eval findings, FOTO, HEP, POC Person educated: Patient Education method: Explanation, Demonstration, and Handouts Education comprehension: verbalized understanding and returned demonstration   HOME EXERCISE PROGRAM: Access Code: [email or web address] URL: [email or web address] Date: 03/08/2022 Prepared by: DaOctavio Manns Exercises - Corner Pec Major Stretch  - 2 x daily - 7 x weekly - 2 sets - 30 sec hold - Standing Shoulder Internal Rotation Stretch with Towel  - 2 x daily - 7 x weekly - 2 reps - 30 sec hold   ASSESSMENT:   CLINICAL IMPRESSION: Pt was able to complete all prescribed exercises with no adverse effect. Therapy focused on improving periscapular stability and and thoracic mobility. He is responding well to PT treatment thus far, will continue to progress as able per POC.   OBJECTIVE IMPAIRMENTS: decreased ROM, impaired UE functional use, and pain.    ACTIVITY LIMITATIONS: carrying, lifting, and reach over head   PARTICIPATION LIMITATIONS: community activity and occupation   PERSONAL FACTORS: Time since onset of injury/illness/exacerbation are also affecting patient's functional outcome.      GOALS: Goals reviewed with patient? No   SHORT TERM GOALS: Target  date: 03/29/2022   Pt will be compliant and knowledgeable with initial HEP for improved comfort and carryover Baseline: initial HEP given  Goal status: INITIAL   2.  Pt will self report right shoulder pain no greater than 3/10 for improved comfort and functional ability Baseline: 7/10 at worst Goal status: INITIAL    LONG TERM GOALS: Target date: 05/03/2022   Pt will self report right shoulder pain no greater than 0/10 for improved comfort and functional ability Baseline: 7/10 at worst Goal status: INITIAL   2.  Pt will improve FOTO function score to no less than 79% as proxy for functional improvement Baseline: 67% function Goal status: INITIAL    3.  Pt will improve right shoulder IR to no less than 75 deg for improved functional ability with overhead reaching Baseline: 48 with pain Goal status: INITIAL   4.  Pt will be compliant and knowledgeable with advanced HEP for improved carryover post discharge from therapy Baseline: initial HEP given  Goal status: INITIAL     PLAN:   PT FREQUENCY: 1x/week   PT DURATION: 8 weeks   PLANNED INTERVENTIONS: Therapeutic exercises, Therapeutic activity, Neuromuscular re-education, Balance training, Gait training, Patient/Family education, Self Care, Joint mobilization, Dry Needling, Electrical stimulation, Cryotherapy, Moist heat, Vasopneumatic device, Manual  therapy, and Re-evaluation   PLAN FOR NEXT SESSION: assess response to TPDN and HEP, progress as able   Ward Chatters, PT 04/02/2022, 7:40 AM

## 2022-04-02 ENCOUNTER — Ambulatory Visit: Payer: 59 | Admitting: Sports Medicine

## 2022-04-02 NOTE — Progress Notes (Deleted)
Matthew Holt D.[facility] [place] Phone: [phone]   Assessment and Plan:     There are no diagnoses linked to this encounter.  ***   Pertinent previous records reviewed include ***   Follow Up: ***     Subjective:   I, Matthew Holt, am serving as a Education administrator for Doctor Matthew Holt   Chief Complaint: right shoulder pain    HPI:    03/04/22 Patient is a 46 year old male complaining of right shoulder pain. Patient states has been manageable for some time has been seeing chiro feels like its getting worst , does do boxing classes, he isnt able to lift , lateral raises hurts , does radiate down to the bicep , no numbness tingling, no meds for the pain, decreased ROM due to pain,   04/03/2022 Patient states    Relevant Historical Information: Ulcerative colitis  Additional pertinent review of systems negative.   Current Outpatient Medications:    acetaminophen (TYLENOL) 500 MG tablet, Take 1,000 mg by mouth every 6 (six) hours as needed for mild pain or fever., Disp: , Rfl:    ibuprofen (ADVIL) 200 MG tablet, Take 600 mg by mouth every 6 (six) hours as needed for fever or moderate pain., Disp: , Rfl:    meloxicam (MOBIC) 15 MG tablet, Take 1 tablet (15 mg total) by mouth daily., Disp: 30 tablet, Rfl: 0   mesalamine (CANASA) 1000 MG suppository, PLACE 1 SUPPOSITORY (1,000 MG TOTAL) RECTALLY AT BEDTIME., Disp: 90 suppository, Rfl: 0   mesalamine (LIALDA) 1.2 g EC tablet, Take 2 tablets (2.4 g total) by mouth daily with breakfast., Disp: 60 tablet, Rfl: 3   Multiple Vitamins-Minerals (MULTIVITAMIN WITH MINERALS) tablet, Take 1 tablet by mouth daily., Disp: , Rfl:    Omega-3 Fatty Acids (FISH OIL PO), Take 1 tablet by mouth daily., Disp: , Rfl:    predniSONE (DELTASONE) 20 MG tablet, Take 2 pills for 3 days, 1 pill for 4 days, Disp: 10 tablet, Rfl: 0   Objective:     There were no vitals filed for this  visit.    There is no height or weight on file to calculate BMI.    Physical Exam:    ***   Electronically signed by:  Matthew Holt D.[facility] 12:51 PM 04/02/22

## 2022-04-03 ENCOUNTER — Ambulatory Visit: Payer: 59 | Admitting: Sports Medicine

## 2022-04-03 ENCOUNTER — Ambulatory Visit: Payer: 59 | Admitting: Gastroenterology

## 2022-04-03 ENCOUNTER — Encounter: Payer: Self-pay | Admitting: Gastroenterology

## 2022-04-03 ENCOUNTER — Other Ambulatory Visit (INDEPENDENT_AMBULATORY_CARE_PROVIDER_SITE_OTHER): Payer: 59

## 2022-04-03 VITALS — BP 114/78 | HR 86 | Ht 75.0 in | Wt 226.0 lb

## 2022-04-03 DIAGNOSIS — K51911 Ulcerative colitis, unspecified with rectal bleeding: Secondary | ICD-10-CM

## 2022-04-03 LAB — C-REACTIVE PROTEIN: CRP: <1 mg/dL (ref 0.5–20.0)

## 2022-04-03 LAB — FOLATE: Folate: 15.9 ng/mL (ref >5.9)

## 2022-04-03 LAB — SEDIMENTATION RATE: Sed Rate: 8 mm/hr (ref 0–15)

## 2022-04-03 LAB — VITAMIN B12: Vitamin B-12: 388 pg/mL (ref 211–911)

## 2022-04-03 NOTE — Progress Notes (Signed)
ERNEST POPOWSKI    [id number]    07-04-1975  Primary Care Physician:Parker, Algis Greenhouse, MD  Referring Physician: Vivi Barrack, [address] [place],  [place] [id number]   Chief complaint:  UC  HPI:  46 year old very pleasant gentleman with history of ulcerative rectosigmoiditis here for follow-up for UC  He has been having on and off small-volume bright red blood per rectum for past few months but has spontaneously resolved in the last 44 to 44 weeks.  He is using Canasa suppositories as needed but has not been taking oral mesalamine.  He is trying to manage his symptoms with dietary modifications, is drinking daily kombucha.  He has noticed that his symptoms are worse whenever he drinks beer but no change when he drinks hard liquor  Review of system positive for joint pain, is currently followed by Ortho.  ANA positive with low titer.  Negative for rheumatoid arthritis.  f no improvement will consider referral to rheumatology  EGD 06/18/21 - Z-line regular, 44 cm from the incisors. - Gastritis. Biopsied. - Normal examined duodenum.  Colonoscopy 06/18/21 - Erythematous mucosa in the rectum. Biopsied. - Normal mucosa in the sigmoid colon, in the descending colon, in the transverse colon, in the ascending colon and in the cecum. Biopsied. - Non-bleeding external and internal hemorrhoids.  1. Surgical [P], gastric antrum and gastric body - ANTRAL AND OXYNTIC MUCOSA WITH HYPEREMIA. - NO HELICOBACTER PYLORI IDENTIFIED. 2. Surgical [P], colon, cecum, polyp (1) - COLONIC MUCOSA WITH BENIGN LYMPHOID AGGREGATE. - MULTIPLE ADDITIONAL LEVELS EXAMINED. 3. Surgical [P], random right colon sites - UNREMARKABLE COLONIC MUCOSA. - NO ACTIVE INFLAMMATION OR CHRONIC CHANGES. - NEGATIVE FOR DYSPLASIA. 4. Surgical [P], random left colon sites - UNREMARKABLE COLONIC MUCOSA. - NO ACTIVE INFLAMMATION OR CHRONIC CHANGES. - NEGATIVE FOR DYSPLASIA. 5. Surgical [P], colon, rectum -  COLONIC MUCOSA WITH REACTIVE LYMPHOID AGGREGATES AND MILD CHRONIC CHANGES. - NO ACTIVE INFLAMMATION IDENTIFIED. - NEGATIVE FOR DYSPLASIA.   Outpatient Encounter Medications as of 04/03/2022  Medication Sig   acetaminophen (TYLENOL) 500 MG tablet Take 1,000 mg by mouth every 6 (six) hours as needed for mild pain or fever.   ibuprofen (ADVIL) 200 MG tablet Take 600 mg by mouth every 6 (six) hours as needed for fever or moderate pain.   meloxicam (MOBIC) 15 MG tablet Take 1 tablet (15 mg total) by mouth daily. (Patient taking differently: Take 15 mg by mouth daily as needed.)   Multiple Vitamins-Minerals (MULTIVITAMIN WITH MINERALS) tablet Take 1 tablet by mouth daily.   Omega-3 Fatty Acids (FISH OIL PO) Take 1 tablet by mouth daily.   mesalamine (CANASA) 1000 MG suppository PLACE 1 SUPPOSITORY (1,000 MG TOTAL) RECTALLY AT BEDTIME. (Patient not taking: Reported on 04/03/2022)   mesalamine (LIALDA) 1.2 g EC tablet Take 2 tablets (2.4 g total) by mouth daily with breakfast. (Patient not taking: Reported on 04/03/2022)   [DISCONTINUED] predniSONE (DELTASONE) 20 MG tablet Take 2 pills for 3 days, 1 pill for 4 days   No facility-administered encounter medications on file as of 04/03/2022.    Allergies as of 04/03/2022 - Review Complete 04/03/2022  Allergen Reaction Noted   Azithromycin Rash 07/05/2019    Past Medical History:  Diagnosis Date   Arthritis    Colitis    Dyslipidemia 09/05/2020   Ulcerative colitis with rectal bleeding [facility])     Past Surgical History:  Procedure Laterality Date   COLONOSCOPY  WISDOM TOOTH EXTRACTION      Family History  Problem Relation Age of Onset   Prostate cancer Father    Skin cancer Father    Heart disease Maternal Grandfather    Colon cancer Neg Hx    Esophageal cancer Neg Hx    Stomach cancer Neg Hx    Pancreatic cancer Neg Hx    Liver disease Neg Hx     Social History   Socioeconomic History   Marital status: Married    Spouse  name: Not on file   Number of children: Not on file   Years of education: Not on file   Highest education level: Not on file  Occupational History   Not on file  Tobacco Use   Smoking status: Light Smoker    Types: Cigars   Smokeless tobacco: Never   Tobacco comments:    cigars on occasion  Vaping Use   Vaping Use: Never used  Substance and Sexual Activity   Alcohol use: Yes    Alcohol/week: 5.0 - 6.0 standard drinks of alcohol    Types: 3 - 4 Cans of beer, 2 Standard drinks or equivalent per week    Comment: 2x a week   Drug use: No   Sexual activity: Not on file  Other Topics Concern   Not on file  Social History Narrative   Not on file   Social Determinants of Health   Financial Resource Strain: Not on file  Food Insecurity: Not on file  Transportation Needs: Not on file  Physical Activity: Not on file  Stress: Not on file  Social Connections: Not on file  Intimate Partner Violence: Not on file      Review of systems: All other review of systems negative except as mentioned in the HPI.   Physical Exam: Vitals:   04/03/22 0831  BP: 114/78  Pulse: 86  SpO2: 99%   Body mass index is 28.25 kg/m. Gen:      No acute distress HEENT:  sclera anicteric Abd:      soft, non-tender; no palpable masses, no distension Ext:    No edema Neuro: alert and oriented x 3 Psych: normal mood and affect  Data Reviewed:  Reviewed labs, radiology imaging, old records and pertinent past GI work up   Assessment and Plan/Recommendations:  46 year old very pleasant gentleman with history of ulcerative rectosigmoiditis, intermittent mild symptoms with rectal bleeding He remains reluctant to use any oral medications including mesalamine, or immunosuppressive agents  Reviewed recent labs, inflammatory markers were low, will recheck ESR and CRP, if continues to remain low plan to monitor  Use Canasa suppositories as needed Advised patient to avoid highly processed foods, add  variety with different fruits and vegetables and switch up fermented foods, avoid drinking kombucha every day or taking probiotics  IBD health maintenance: Follow-up B12 and folate level  Return in 3 months   The patient was provided an opportunity to ask questions and all were answered. The patient agreed with the plan and demonstrated an understanding of the instructions.  Damaris Hippo , MD    CC: Vivi Barrack, MD

## 2022-04-03 NOTE — Patient Instructions (Signed)
Your provider has requested that you go to the basement level for lab work before leaving today. Press "B" on the elevator. The lab is located at the first door on the left as you exit the elevator.   Due to recent changes in healthcare laws, you may see the results of your imaging and laboratory studies on MyChart before your provider has had a chance to review them.  We understand that in some cases there may be results that are confusing or concerning to you. Not all laboratory results come back in the same time frame and the provider may be waiting for multiple results in order to interpret others.  Please give [place] 48 hours in order for your provider to thoroughly review all the results before contacting the office for clarification of your results.    I appreciate the opportunity to care for you. Harl Bowie, MD

## 2022-04-05 ENCOUNTER — Encounter: Payer: Self-pay | Admitting: Gastroenterology

## 2022-04-23 ENCOUNTER — Ambulatory Visit: Payer: 59 | Attending: Sports Medicine

## 2022-04-23 DIAGNOSIS — G8929 Other chronic pain: Secondary | ICD-10-CM | POA: Diagnosis present

## 2022-04-23 DIAGNOSIS — M6281 Muscle weakness (generalized): Secondary | ICD-10-CM | POA: Insufficient documentation

## 2022-04-23 DIAGNOSIS — M25511 Pain in right shoulder: Secondary | ICD-10-CM | POA: Diagnosis not present

## 2022-04-23 NOTE — Therapy (Addendum)
OUTPATIENT PHYSICAL THERAPY TREATMENT NOTE/DISCHARGE  PHYSICAL THERAPY DISCHARGE SUMMARY  Visits from Start of Care: 3  Current functional level related to goals / functional outcomes: See goals and objective   Remaining deficits: See goals and objective   Education / Equipment: HEP   Patient agrees to discharge. Patient goals were  partially met . Patient is being discharged due to not returning since the last visit.   Patient Name: Matthew Holt MRN: [id number] DOB:December 28, 1975, 47 y.o., male Today's Date: 06/11/2022  PCP: Vivi Barrack, MD  REFERRING PROVIDER: [name], DO   END OF SESSION:      Past Medical History:  Diagnosis Date   Arthritis    Colitis    Dyslipidemia 09/05/2020   Ulcerative colitis with rectal bleeding [facility])    Past Surgical History:  Procedure Laterality Date   COLONOSCOPY     WISDOM TOOTH EXTRACTION     Patient Active Problem List   Diagnosis Date Noted   Dyslipidemia 09/05/2020   Ulcerative colitis with rectal bleeding ([place])     REFERRING DIAG: M25.511,G89.29 (ICD-10-CM) - Chronic right shoulder pain    THERAPY DIAG:  Chronic right shoulder pain  Muscle weakness (generalized)  Rationale for Evaluation and Treatment Rehabilitation  PERTINENT HISTORY: None   PRECAUTIONS: None   SUBJECTIVE:                                                                                                                                                                                      SUBJECTIVE STATEMENT:  Pt presents to PT with reports of continued R shoulder pain. Has been compliant with HEP. Ready to begin PT at this time.   PAIN:  Are you having pain?  Yes: NPRS scale: 2/10 Worst: 7/10 at worst Pain location: R anterior shoulder Pain description: sharp  Aggravating factors: lifting weights,  Relieving factors: chiropractic, rest   OBJECTIVE: (objective measures completed at initial evaluation unless otherwise  dated)  DIAGNOSTIC FINDINGS:  See imaging   PATIENT SURVEYS:  FOTO: 67% function; 79% function   COGNITION: Overall cognitive status: Within functional limits for tasks assessed                                  SENSATION: WFL   POSTURE: Rounded shoulders   UPPER EXTREMITY ROM:    Active ROM Right eval Left eval  Shoulder flexion WNL WNL  Shoulder extension WNL WNL  Shoulder abduction WNL WNL  Shoulder adduction WNL WNL  Shoulder internal rotation 48 p! WNL  Shoulder external rotation WNL WNL  Elbow flexion  Elbow extension      Wrist flexion      Wrist extension      Wrist ulnar deviation      Wrist radial deviation      Wrist pronation      Wrist supination      (Blank rows = not tested)   UPPER EXTREMITY MMT:   MMT Right eval Left eval  Shoulder flexion WNL WNL  Shoulder extension      Shoulder abduction WNL WNL  Shoulder adduction WNL WNL  Shoulder internal rotation WNL WNL  Shoulder external rotation WNL WNL  Middle trapezius      Lower trapezius      Elbow flexion      Elbow extension      Wrist flexion      Wrist extension      Wrist ulnar deviation      Wrist radial deviation      Wrist pronation      Wrist supination      Grip strength (lbs)      (Blank rows = not tested)   SHOULDER SPECIAL TESTS: Impingement tests: Hawkins/Kennedy impingement test: positive  and Painful arc test: positive  SLAP lesions: DNT Instability tests: DNT Rotator cuff assessment: Empty can test: positive  and Full can test: negative Biceps assessment: DNT   JOINT MOBILITY TESTING:  WFL   PALPATION:  TTP to R subscapularis             TREATMENT: [facility] Adult PT Treatment:                                                DATE: 04/23/2022 Therapeutic Exercise: UBE lvl 2.0 x 4 min while taking subjective Prone W 2x10 Prone Y 2x10 Prone T 2x10 Corner stretch 2x30" Pec stretch on foam x 60" Standing W against wall x 10 - difficult Manual Therapy: Skilled  palpation of trigger points during TPDN Trigger Point Dry Needling Treatment: Pre-treatment instruction: Patient instructed on dry needling rationale, procedures, and possible side effects including pain during treatment (achy,cramping feeling), bruising, drop of blood, lightheadedness, nausea, sweating. Patient Consent Given: Yes Education handout provided: Yes Muscles treated: R subscapularis, R rhomboid major, R anterior deltoid  Needle size and number: .30x42m x 2 and .30x317mx 1 Electrical stimulation performed: No Parameters: N/A Treatment response/outcome: Twitch response elicited and Palpable decrease in muscle tension Post-treatment instructions: Patient instructed to expect possible mild to moderate muscle soreness later today and/or tomorrow. Patient instructed in methods to reduce muscle soreness and to continue prescribed HEP. If patient was dry needled over the lung field, patient was instructed on signs and symptoms of pneumothorax and, however unlikely, to see immediate medical attention should they occur. Patient was also educated on signs and symptoms of infection and to seek medical attention should they occur. Patient verbalized understanding of these instructions and education.   [facility]                                                DATE: 04/01/2022 Therapeutic Exercise: UBE lvl 2.0 x 4 min while taking subjective Prone W 2x10 Prone Y 2x10 Prone T 2x10 Supine horizontal abd 2x15 BTB Manual Therapy:  STM and trigger point release to R bicep tendon Skilled palpation of trigger points during TPDN Trigger Point Dry Needling Treatment: Pre-treatment instruction: Patient instructed on dry needling rationale, procedures, and possible side effects including pain during treatment (achy,cramping feeling), bruising, drop of blood, lightheadedness, nausea, sweating. Patient Consent Given: Yes Education handout provided: Yes Muscles treated: R subscapularis, R  rhomboid major, R anterior deltoid  Needle size and number: .30x58m x 2 and .30x352mx 1 Electrical stimulation performed: No Parameters: N/A Treatment response/outcome: Twitch response elicited and Palpable decrease in muscle tension Post-treatment instructions: Patient instructed to expect possible mild to moderate muscle soreness later today and/or tomorrow. Patient instructed in methods to reduce muscle soreness and to continue prescribed HEP. If patient was dry needled over the lung field, patient was instructed on signs and symptoms of pneumothorax and, however unlikely, to see immediate medical attention should they occur. Patient was also educated on signs and symptoms of infection and to seek medical attention should they occur. Patient verbalized understanding of these instructions and education.   [facility]                                                DATE: 03/07/2022 Therapeutic Exercise: Corner stretch x 30"  IR towel stretch x 30" R Trigger Point Dry Needling Treatment: Pre-treatment instruction: Patient instructed on dry needling rationale, procedures, and possible side effects including pain during treatment (achy,cramping feeling), bruising, drop of blood, lightheadedness, nausea, sweating. Patient Consent Given: Yes Education handout provided: Yes Muscles treated: R subscapularis, R rhomboid major, R anterior deltoid  Needle size and number: .30x502m 2 and .30x30m66m1 Electrical stimulation performed: No Parameters: N/A Treatment response/outcome: Twitch response elicited and Palpable decrease in muscle tension Post-treatment instructions: Patient instructed to expect possible mild to moderate muscle soreness later today and/or tomorrow. Patient instructed in methods to reduce muscle soreness and to continue prescribed HEP. If patient was dry needled over the lung field, patient was instructed on signs and symptoms of pneumothorax and, however unlikely, to  see immediate medical attention should they occur. Patient was also educated on signs and symptoms of infection and to seek medical attention should they occur. Patient verbalized understanding of these instructions and education.    PATIENT EDUCATION: Education details: eval findings, FOTO, HEP, POC Person educated: Patient Education method: Explanation, Demonstration, and Handouts Education comprehension: verbalized understanding and returned demonstration   HOME EXERCISE PROGRAM: Access Code: [email or web address] URL: [email or web address] Date: 04/24/2022 Prepared by: DaviOctavio Mannsercises - Corner Pec Major Stretch  - 2 x daily - 7 x weekly - 2 sets - 30 sec hold - Standing Shoulder Internal Rotation Stretch with Towel  - 2 x daily - 7 x weekly - 2 reps - 30 sec hold - Supine Chest Stretch on Foam Roll  - 2 x daily - 7 x weekly - 2 reps - 60 sec hold - Standing Shoulder W at Wall  - 3-4 x weekly - 2 sets - 10 reps - Prone Scapular Retraction Y  - 3-4 x weekly - 2 sets - 10 reps - Prone T  - 3-4 x weekly - 2 sets - 10 reps   ASSESSMENT:   CLINICAL IMPRESSION: Pt was able to complete prescribed exercises, continues to have difficulty with periscapular isolating exercises and ROM restrictions secondary to pec tightness.  Responded well to TPDN today. HEP updated for continued periscapular strengthening, will continue per POC.    OBJECTIVE IMPAIRMENTS: decreased ROM, impaired UE functional use, and pain.    ACTIVITY LIMITATIONS: carrying, lifting, and reach over head   PARTICIPATION LIMITATIONS: community activity and occupation   PERSONAL FACTORS: Time since onset of injury/illness/exacerbation are also affecting patient's functional outcome.      GOALS: Goals reviewed with patient? No   SHORT TERM GOALS: Target date: 03/29/2022   Pt will be compliant and knowledgeable with initial HEP for improved comfort and carryover Baseline: initial HEP given  Goal status:  INITIAL   2.  Pt will self report right shoulder pain no greater than 3/10 for improved comfort and functional ability Baseline: 7/10 at worst Goal status: INITIAL    LONG TERM GOALS: Target date: 05/03/2022   Pt will self report right shoulder pain no greater than 0/10 for improved comfort and functional ability Baseline: 7/10 at worst Goal status: INITIAL   2.  Pt will improve FOTO function score to no less than 79% as proxy for functional improvement Baseline: 67% function Goal status: INITIAL    3.  Pt will improve right shoulder IR to no less than 75 deg for improved functional ability with overhead reaching Baseline: 48 with pain Goal status: INITIAL   4.  Pt will be compliant and knowledgeable with advanced HEP for improved carryover post discharge from therapy Baseline: initial HEP given  Goal status: INITIAL     PLAN:   PT FREQUENCY: 1x/week   PT DURATION: 8 weeks   PLANNED INTERVENTIONS: Therapeutic exercises, Therapeutic activity, Neuromuscular re-education, Balance training, Gait training, Patient/Family education, Self Care, Joint mobilization, Dry Needling, Electrical stimulation, Cryotherapy, Moist heat, Vasopneumatic device, Manual therapy, and Re-evaluation   PLAN FOR NEXT SESSION: assess response to TPDN and HEP, progress as able   Ward Chatters, PT 06/11/2022, 1:34 PM

## 2022-05-08 ENCOUNTER — Ambulatory Visit: Payer: 59

## 2022-05-08 NOTE — Therapy (Incomplete)
OUTPATIENT PHYSICAL THERAPY TREATMENT NOTE   Patient Name: Matthew Holt MRN: [id number] DOB:11/04/1975, 47 y.o., male Today's Date: 05/08/2022  PCP: Vivi Barrack, MD  REFERRING PROVIDER: [name], DO   END OF SESSION:      Past Medical History:  Diagnosis Date   Arthritis    Colitis    Dyslipidemia 09/05/2020   Ulcerative colitis with rectal bleeding [facility])    Past Surgical History:  Procedure Laterality Date   COLONOSCOPY     WISDOM TOOTH EXTRACTION     Patient Active Problem List   Diagnosis Date Noted   Dyslipidemia 09/05/2020   Ulcerative colitis with rectal bleeding ([place])     REFERRING DIAG: M25.511,G89.29 (ICD-10-CM) - Chronic right shoulder pain    THERAPY DIAG:  No diagnosis found.  Rationale for Evaluation and Treatment Rehabilitation  PERTINENT HISTORY: None   PRECAUTIONS: None   SUBJECTIVE:                                                                                                                                                                                      SUBJECTIVE STATEMENT:  ***   PAIN:  Are you having pain?  Yes: NPRS scale: 2/10 Worst: 7/10 at worst Pain location: R anterior shoulder Pain description: sharp  Aggravating factors: lifting weights,  Relieving factors: chiropractic, rest   OBJECTIVE: (objective measures completed at initial evaluation unless otherwise dated)  DIAGNOSTIC FINDINGS:  See imaging   PATIENT SURVEYS:  FOTO: 67% function; 79% function   COGNITION: Overall cognitive status: Within functional limits for tasks assessed                                  SENSATION: WFL   POSTURE: Rounded shoulders   UPPER EXTREMITY ROM:    Active ROM Right eval Left eval  Shoulder flexion WNL WNL  Shoulder extension WNL WNL  Shoulder abduction WNL WNL  Shoulder adduction WNL WNL  Shoulder internal rotation 48 p! WNL  Shoulder external rotation WNL WNL  Elbow flexion      Elbow extension       Wrist flexion      Wrist extension      Wrist ulnar deviation      Wrist radial deviation      Wrist pronation      Wrist supination      (Blank rows = not tested)   UPPER EXTREMITY MMT:   MMT Right eval Left eval  Shoulder flexion WNL WNL  Shoulder extension      Shoulder abduction WNL WNL  Shoulder adduction WNL WNL  Shoulder internal rotation WNL WNL  Shoulder external rotation WNL WNL  Middle trapezius      Lower trapezius      Elbow flexion      Elbow extension      Wrist flexion      Wrist extension      Wrist ulnar deviation      Wrist radial deviation      Wrist pronation      Wrist supination      Grip strength (lbs)      (Blank rows = not tested)   SHOULDER SPECIAL TESTS: Impingement tests: Hawkins/Kennedy impingement test: positive  and Painful arc test: positive  SLAP lesions: DNT Instability tests: DNT Rotator cuff assessment: Empty can test: positive  and Full can test: negative Biceps assessment: DNT   JOINT MOBILITY TESTING:  WFL   PALPATION:  TTP to R subscapularis             TREATMENT: [facility] Adult PT Treatment:                                                DATE: 05/08/2022 Therapeutic Exercise: UBE lvl 2.0 x 4 min while taking subjective Prone W 2x10 Prone Y 2x10 Prone T 2x10 Corner stretch 2x30" Pec stretch on foam x 60" Standing W against wall x 10 - difficult Manual Therapy: Skilled palpation of trigger points during TPDN Trigger Point Dry Needling Treatment: Pre-treatment instruction: Patient instructed on dry needling rationale, procedures, and possible side effects including pain during treatment (achy,cramping feeling), bruising, drop of blood, lightheadedness, nausea, sweating. Patient Consent Given: Yes Education handout provided: Yes Muscles treated: R subscapularis, R rhomboid major, R anterior deltoid  Needle size and number: .30x68m x 2 and .30x368mx 1 Electrical stimulation performed: No Parameters: N/A Treatment  response/outcome: Twitch response elicited and Palpable decrease in muscle tension Post-treatment instructions: Patient instructed to expect possible mild to moderate muscle soreness later today and/or tomorrow. Patient instructed in methods to reduce muscle soreness and to continue prescribed HEP. If patient was dry needled over the lung field, patient was instructed on signs and symptoms of pneumothorax and, however unlikely, to see immediate medical attention should they occur. Patient was also educated on signs and symptoms of infection and to seek medical attention should they occur. Patient verbalized understanding of these instructions and education.   [facility]:                                                DATE: 04/23/2022 Therapeutic Exercise: UBE lvl 2.0 x 4 min while taking subjective Prone W 2x10 Prone Y 2x10 Prone T 2x10 Corner stretch 2x30" Pec stretch on foam x 60" Standing W against wall x 10 - difficult Manual Therapy: Skilled palpation of trigger points during TPDN Trigger Point Dry Needling Treatment: Pre-treatment instruction: Patient instructed on dry needling rationale, procedures, and possible side effects including pain during treatment (achy,cramping feeling), bruising, drop of blood, lightheadedness, nausea, sweating. Patient Consent Given: Yes Education handout provided: Yes Muscles treated: R subscapularis, R rhomboid major, R anterior deltoid  Needle size and number: .30x5062m 2 and .30x30m83m1 Electrical stimulation performed: No Parameters: N/A Treatment response/outcome: Twitch response elicited  and Palpable decrease in muscle tension Post-treatment instructions: Patient instructed to expect possible mild to moderate muscle soreness later today and/or tomorrow. Patient instructed in methods to reduce muscle soreness and to continue prescribed HEP. If patient was dry needled over the lung field, patient was instructed on signs and symptoms of  pneumothorax and, however unlikely, to see immediate medical attention should they occur. Patient was also educated on signs and symptoms of infection and to seek medical attention should they occur. Patient verbalized understanding of these instructions and education.   [facility] PT Treatment:                                                DATE: 04/01/2022 Therapeutic Exercise: UBE lvl 2.0 x 4 min while taking subjective Prone W 2x10 Prone Y 2x10 Prone T 2x10 Supine horizontal abd 2x15 BTB Manual Therapy: STM and trigger point release to R bicep tendon Skilled palpation of trigger points during TPDN Trigger Point Dry Needling Treatment: Pre-treatment instruction: Patient instructed on dry needling rationale, procedures, and possible side effects including pain during treatment (achy,cramping feeling), bruising, drop of blood, lightheadedness, nausea, sweating. Patient Consent Given: Yes Education handout provided: Yes Muscles treated: R subscapularis, R rhomboid major, R anterior deltoid  Needle size and number: .30x59m x 2 and .30x342mx 1 Electrical stimulation performed: No Parameters: N/A Treatment response/outcome: Twitch response elicited and Palpable decrease in muscle tension Post-treatment instructions: Patient instructed to expect possible mild to moderate muscle soreness later today and/or tomorrow. Patient instructed in methods to reduce muscle soreness and to continue prescribed HEP. If patient was dry needled over the lung field, patient was instructed on signs and symptoms of pneumothorax and, however unlikely, to see immediate medical attention should they occur. Patient was also educated on signs and symptoms of infection and to seek medical attention should they occur. Patient verbalized understanding of these instructions and education.   [facility]:                                                DATE: 03/07/2022 Therapeutic Exercise: Corner stretch x 30"   IR towel stretch x 30" R Trigger Point Dry Needling Treatment: Pre-treatment instruction: Patient instructed on dry needling rationale, procedures, and possible side effects including pain during treatment (achy,cramping feeling), bruising, drop of blood, lightheadedness, nausea, sweating. Patient Consent Given: Yes Education handout provided: Yes Muscles treated: R subscapularis, R rhomboid major, R anterior deltoid  Needle size and number: .30x5085m 2 and .30x30m95m1 Electrical stimulation performed: No Parameters: N/A Treatment response/outcome: Twitch response elicited and Palpable decrease in muscle tension Post-treatment instructions: Patient instructed to expect possible mild to moderate muscle soreness later today and/or tomorrow. Patient instructed in methods to reduce muscle soreness and to continue prescribed HEP. If patient was dry needled over the lung field, patient was instructed on signs and symptoms of pneumothorax and, however unlikely, to see immediate medical attention should they occur. Patient was also educated on signs and symptoms of infection and to seek medical attention should they occur. Patient verbalized understanding of these instructions and education.    PATIENT EDUCATION: Education details: eval findings, FOTO, HEP, POC Person educated: Patient Education method: Explanation,  Demonstration, and Handouts Education comprehension: verbalized understanding and returned demonstration   HOME EXERCISE PROGRAM: Access Code: [email or web address] URL: [email or web address] Date: 04/24/2022 Prepared by: Octavio Manns  Exercises - Corner Pec Major Stretch  - 2 x daily - 7 x weekly - 2 sets - 30 sec hold - Standing Shoulder Internal Rotation Stretch with Towel  - 2 x daily - 7 x weekly - 2 reps - 30 sec hold - Supine Chest Stretch on Foam Roll  - 2 x daily - 7 x weekly - 2 reps - 60 sec hold - Standing Shoulder W at Wall  - 3-4 x weekly - 2 sets - 10 reps - Prone  Scapular Retraction Y  - 3-4 x weekly - 2 sets - 10 reps - Prone T  - 3-4 x weekly - 2 sets - 10 reps   ASSESSMENT:   CLINICAL IMPRESSION: ***   OBJECTIVE IMPAIRMENTS: decreased ROM, impaired UE functional use, and pain.    ACTIVITY LIMITATIONS: carrying, lifting, and reach over head   PARTICIPATION LIMITATIONS: community activity and occupation   PERSONAL FACTORS: Time since onset of injury/illness/exacerbation are also affecting patient's functional outcome.      GOALS: Goals reviewed with patient? No   SHORT TERM GOALS: Target date: 03/29/2022   Pt will be compliant and knowledgeable with initial HEP for improved comfort and carryover Baseline: initial HEP given  Goal status: INITIAL   2.  Pt will self report right shoulder pain no greater than 3/10 for improved comfort and functional ability Baseline: 7/10 at worst Goal status: INITIAL    LONG TERM GOALS: Target date: 05/03/2022   Pt will self report right shoulder pain no greater than 0/10 for improved comfort and functional ability Baseline: 7/10 at worst Goal status: INITIAL   2.  Pt will improve FOTO function score to no less than 79% as proxy for functional improvement Baseline: 67% function Goal status: INITIAL    3.  Pt will improve right shoulder IR to no less than 75 deg for improved functional ability with overhead reaching Baseline: 48 with pain Goal status: INITIAL   4.  Pt will be compliant and knowledgeable with advanced HEP for improved carryover post discharge from therapy Baseline: initial HEP given  Goal status: INITIAL     PLAN:   PT FREQUENCY: 1x/week   PT DURATION: 8 weeks   PLANNED INTERVENTIONS: Therapeutic exercises, Therapeutic activity, Neuromuscular re-education, Balance training, Gait training, Patient/Family education, Self Care, Joint mobilization, Dry Needling, Electrical stimulation, Cryotherapy, Moist heat, Vasopneumatic device, Manual therapy, and Re-evaluation   PLAN FOR  NEXT SESSION: assess response to TPDN and HEP, progress as able   Ward Chatters, PT 05/08/2022, 8:27 AM

## 2022-05-23 ENCOUNTER — Ambulatory Visit
Admission: RE | Admit: 2022-05-23 | Discharge: 2022-05-23 | Disposition: A | Discharge status: Home / Self Care | Payer: 59 | Source: Ambulatory Visit | Attending: Urgent Care | Admitting: Urgent Care

## 2022-05-23 VITALS — BP 133/80 | HR 66 | Temp 98.3°F | Resp 17

## 2022-05-23 DIAGNOSIS — J039 Acute tonsillitis, unspecified: Secondary | ICD-10-CM

## 2022-05-23 DIAGNOSIS — K121 Other forms of stomatitis: Secondary | ICD-10-CM

## 2022-05-23 MED ORDER — AMOXICILLIN-POT CLAVULANATE 875-125 MG PO TABS: 1.0000 | ORAL_TABLET | Freq: Two times a day (BID) | ORAL | 0 refills | Status: AC

## 2022-05-23 NOTE — ED Triage Notes (Signed)
Pt c/o sore throat x 3 days. Headache yesterday. Denies fever. Taking ibuprofen and theraflu prn.

## 2022-05-23 NOTE — Discharge Instructions (Addendum)
You have acute tonsillitis. Please start taking antibiotics as prescribed. Do not stop taking them until all has been completed. After completing the third day of antibiotics, throw away your current toothbrush and get a new one. This will prevent re-contamination. You may alternate ibuprofen and tylenol every 4 hours for fever and pain control. Do not share food, beverages, or kiss on the lips until >48 hours after starting antibiotics and >24 hours after fever resolved. This is contagious. Take OTC chloraseptic spray or cepacol lozenges for any continued sore throat. Salt water gargles and honey tea may also be effective.

## 2022-05-23 NOTE — ED Provider Notes (Signed)
[name]    CSN: [id number] Arrival date & time: 05/23/22  1660      History   Chief Complaint Chief Complaint  Patient presents with   Sore Throat    HPI Matthew Holt is a 47 y.o. male.   47yo male presents today with a 5 day hx of sore throat.  Started on Sunday, was mild in nature, but increased last evening to more severe.  Patient states he travels for work, was in [place] last week.  Was traveling extensively and on an airplane.  Denies any known sick contacts.  States since last evening, he had trouble sleeping due to the pain.  This is primarily on the left side.  States he noticed a dark spot and a white patch as well.  Some discomfort in his neck.  Painful swallowing.  Denies any drooling or trismus.  No known fever.  No nausea or vomiting.  No rash. Tried OTC medications with no improvement to symptoms.   Sore Throat    Past Medical History:  Diagnosis Date   Arthritis    Colitis    Dyslipidemia 09/05/2020   Ulcerative colitis with rectal bleeding [facility])     Patient Active Problem List   Diagnosis Date Noted   Dyslipidemia 09/05/2020   Ulcerative colitis with rectal bleeding [facility])     Past Surgical History:  Procedure Laterality Date   COLONOSCOPY     WISDOM TOOTH EXTRACTION         Home Medications    Prior to Admission medications   Medication Sig Start Date End Date Taking? Authorizing Provider  amoxicillin-clavulanate (AUGMENTIN) 875-125 MG tablet Take 1 tablet by mouth 2 (two) times daily with a meal for 10 days. 05/23/22 06/02/22 Yes [name], PA  acetaminophen (TYLENOL) 500 MG tablet Take 1,000 mg by mouth every 6 (six) hours as needed for mild pain or fever.    [provider]  ibuprofen (ADVIL) 200 MG tablet Take 600 mg by mouth every 6 (six) hours as needed for fever or moderate pain.    [provider]  meloxicam (MOBIC) 15 MG tablet Take 1 tablet (15 mg total) by mouth daily. Patient taking  differently: Take 15 mg by mouth daily as needed. 01/23/22   [name], DO  Multiple Vitamins-Minerals (MULTIVITAMIN WITH MINERALS) tablet Take 1 tablet by mouth daily.    [provider]  Omega-3 Fatty Acids (FISH OIL PO) Take 1 tablet by mouth daily.    [provider]    Family History Family History  Problem Relation Age of Onset   Prostate cancer Father    Skin cancer Father    Heart disease Maternal Grandfather    Colon cancer Neg Hx    Esophageal cancer Neg Hx    Stomach cancer Neg Hx    Pancreatic cancer Neg Hx    Liver disease Neg Hx     Social History Social History   Tobacco Use   Smoking status: Light Smoker    Types: Cigars   Smokeless tobacco: Never   Tobacco comments:    cigars on occasion  Vaping Use   Vaping Use: Never used  Substance Use Topics   Alcohol use: Yes    Alcohol/week: 5.0 - 6.0 standard drinks of alcohol    Types: 3 - 4 Cans of beer, 2 Standard drinks or equivalent per week    Comment: 2x a week   Drug use: No     Allergies  Azithromycin   Review of Systems Review of Systems As per HPI  Physical Exam Triage Vital Signs ED Triage Vitals  Enc Vitals Group     BP 05/23/22 0835 133/80     Pulse Rate 05/23/22 0835 66     Resp 05/23/22 0835 17     Temp 05/23/22 0835 98.3 F (36.8 C)     Temp Source 05/23/22 0835 Oral     SpO2 05/23/22 0835 100 %     Weight --      Height --      Head Circumference --      Peak Flow --      Pain Score 05/23/22 0837 3     Pain Loc --      Pain Edu? --      Excl. in [place]? --    No data found.  Updated Vital Signs BP 133/80 (BP Location: Left Arm)   Pulse 66   Temp 98.3 F (36.8 C) (Oral)   Resp 17   SpO2 100%   Visual Acuity Right Eye Distance:   Left Eye Distance:   Bilateral Distance:    Right Eye Near:   Left Eye Near:    Bilateral Near:     Physical Exam Vitals and nursing note reviewed.  Constitutional:      General: He is not in acute distress.     Appearance: He is well-developed and normal weight. He is not toxic-appearing.  HENT:     Head: Normocephalic and atraumatic.     Right Ear: Tympanic membrane and ear canal normal. No drainage, swelling or tenderness. No middle ear effusion. Tympanic membrane is not erythematous.     Left Ear: Tympanic membrane and ear canal normal. No drainage, swelling or tenderness.  No middle ear effusion. Tympanic membrane is not erythematous.     Nose: No congestion or rhinorrhea.     Mouth/Throat:     Mouth: Mucous membranes are moist. Oral lesions (single ulcer to soft palate on L) present.     Pharynx: Uvula midline. Oropharyngeal exudate and posterior oropharyngeal erythema present. No pharyngeal swelling or uvula swelling.     Tonsils: Tonsillar exudate present. No tonsillar abscesses. 2+ on the left.  Eyes:     Conjunctiva/sclera: Conjunctivae normal.     Pupils: Pupils are equal, round, and reactive to light.  Neck:     Thyroid: No thyromegaly.  Cardiovascular:     Rate and Rhythm: Normal rate and regular rhythm.     Heart sounds: No murmur heard.    No gallop.  Pulmonary:     Effort: Pulmonary effort is normal. No respiratory distress.     Breath sounds: Normal breath sounds. No stridor. No wheezing, rhonchi or rales.  Chest:     Chest wall: No tenderness.  Abdominal:     General: Bowel sounds are normal. There is no distension.     Palpations: Abdomen is soft. There is no mass.     Tenderness: There is no abdominal tenderness. There is no rebound.  Musculoskeletal:     Cervical back: Normal range of motion and neck supple.  Lymphadenopathy:     Cervical: Cervical adenopathy present.  Skin:    General: Skin is warm.     Findings: No erythema or rash.  Neurological:     General: No focal deficit present.     Mental Status: He is alert and oriented to person, place, and time.  Psychiatric:  Mood and Affect: Mood normal.        Behavior: Behavior normal.      UC  Treatments / Results  Labs (all labs ordered are listed, but only abnormal results are displayed) Labs Reviewed - No data to display  EKG   Radiology No results found.  Procedures Procedures (including critical care time)  Medications Ordered in UC Medications - No data to display  Initial Impression / Assessment and Plan / UC Course  I have reviewed the triage vital signs and the nursing notes.  Pertinent labs & imaging results that were available during my care of the patient were reviewed by me and considered in my medical decision making (see chart for details).     Acute tonsillitis - sx consistent with tonsillitis. No s/sx of peritonsillar cellulitis or abscess. Start augmentin BID Oral ulceration - likely viral. OTC supportive measures discussed.    Final Clinical Impressions(s) / UC Diagnoses   Final diagnoses:  Acute tonsillitis, unspecified etiology  Oral ulceration     Discharge Instructions      You have acute tonsillitis. Please start taking antibiotics as prescribed. Do not stop taking them until all has been completed. After completing the third day of antibiotics, throw away your current toothbrush and get a new one. This will prevent re-contamination. You may alternate ibuprofen and tylenol every 4 hours for fever and pain control. Do not share food, beverages, or kiss on the lips until >48 hours after starting antibiotics and >24 hours after fever resolved. This is contagious. Take OTC chloraseptic spray or cepacol lozenges for any continued sore throat. Salt water gargles and honey tea may also be effective.     ED Prescriptions     Medication Sig Dispense Auth. Provider   amoxicillin-clavulanate (AUGMENTIN) 875-125 MG tablet Take 1 tablet by mouth 2 (two) times daily with a meal for 10 days. 20 tablet Brodin Gelpi L, [place]      PDMP not reviewed this encounter.   Chaney Malling, [place] 05/23/22 [phone]

## 2022-06-18 NOTE — Progress Notes (Incomplete)
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  I last saw him on 04/03/2022. At that time we discussed occasional rectal bleeding that had spontaneously resolved. He was using Canasa suppositories as needed but had not needed Mesalamine or oral Canasa. He modified his diet to help reduce his symptoms. His symptoms worsened with beer but is unchanged with liquor.  Review of system positive for joint pain, followed by Ortho.  ANA positive with low titer.  Negative for rheumatoid arthritis.    Today, ***    GI Hx  EGD 06/18/21 - Z-line regular, 36 cm from the incisors. - Gastritis. Biopsied. - Normal examined duodenum.  Colonoscopy 06/18/21 - Erythematous mucosa in the rectum. Biopsied. - Normal mucosa in the sigmoid colon, in the descending colon, in the transverse colon, in the ascending colon and in the cecum. Biopsied. - Non-bleeding external and internal hemorrhoids.  1. Surgical [P], gastric antrum and gastric body - ANTRAL AND OXYNTIC MUCOSA WITH HYPEREMIA. - NO HELICOBACTER PYLORI IDENTIFIED. 2. Surgical [P], colon, cecum, polyp (1) - COLONIC MUCOSA WITH BENIGN LYMPHOID AGGREGATE. - MULTIPLE ADDITIONAL LEVELS EXAMINED. 3. Surgical [P], random right colon sites - UNREMARKABLE COLONIC MUCOSA. - NO ACTIVE INFLAMMATION OR CHRONIC CHANGES. - NEGATIVE FOR DYSPLASIA. 4. Surgical [P], random left colon sites - UNREMARKABLE COLONIC MUCOSA. - NO ACTIVE INFLAMMATION OR CHRONIC CHANGES. - NEGATIVE FOR DYSPLASIA. 5. Surgical [P], colon, rectum - COLONIC MUCOSA WITH REACTIVE LYMPHOID AGGREGATES AND MILD CHRONIC CHANGES. - NO ACTIVE INFLAMMATION IDENTIFIED. - NEGATIVE FOR DYSPLASIA.   Current Outpatient  Medications:    acetaminophen (TYLENOL) 500 MG tablet, Take 1,000 mg by mouth every 6 (six) hours as needed for mild pain or fever., Disp: , Rfl:    ibuprofen (ADVIL) 200 MG tablet, Take 600 mg by mouth every 6 (six) hours as needed for fever or moderate pain., Disp: , Rfl:    meloxicam (MOBIC) 15 MG tablet, Take 1 tablet (15 mg total) by mouth daily. (Patient taking differently: Take 15 mg by mouth daily as needed.), Disp: 30 tablet, Rfl: 0   Multiple Vitamins-Minerals (MULTIVITAMIN WITH MINERALS) tablet, Take 1 tablet by mouth daily., Disp: , Rfl:    Omega-3 Fatty Acids (FISH OIL PO), Take 1 tablet by mouth daily., Disp: , Rfl:     Allergies as of 06/19/2022 - Review Complete 05/23/2022  Allergen Reaction Noted   Azithromycin Rash 07/05/2019    Past Medical History:  Diagnosis Date   Arthritis    Colitis    Dyslipidemia 09/05/2020   Ulcerative colitis with rectal bleeding [facility])     Past Surgical History:  Procedure Laterality Date   COLONOSCOPY     WISDOM TOOTH EXTRACTION      Family History  Problem Relation Age of Onset   Prostate cancer Father    Skin cancer Father    Heart disease Maternal Grandfather    Colon cancer Neg Hx    Esophageal cancer Neg Hx    Stomach cancer Neg Hx    Pancreatic cancer Neg Hx    Liver disease Neg Hx     Social History   Socioeconomic History   Marital status: Married    Spouse  name: Not on file   Number of children: Not on file   Years of education: Not on file   Highest education level: Not on file  Occupational History   Not on file  Tobacco Use   Smoking status: Light Smoker    Types: Cigars   Smokeless tobacco: Never   Tobacco comments:    cigars on occasion  Vaping Use   Vaping Use: Never used  Substance and Sexual Activity   Alcohol use: Yes    Alcohol/week: 5.0 - 6.0 standard drinks of alcohol    Types: 3 - 4 Cans of beer, 2 Standard drinks or equivalent per week    Comment: 2x a week   Drug use: No   Sexual  activity: Not on file  Other Topics Concern   Not on file  Social History Narrative   Not on file   Social Determinants of Health   Financial Resource Strain: Not on file  Food Insecurity: Not on file  Transportation Needs: Not on file  Physical Activity: Not on file  Stress: Not on file  Social Connections: Not on file  Intimate Partner Violence: Not on file      Review of systems: Review of Systems  Constitutional:  Negative for appetite change and fever.  HENT:  Negative for trouble swallowing.   Respiratory:  Negative for cough and shortness of breath.   Cardiovascular:  Negative for chest pain.  Gastrointestinal:  Negative for abdominal distention, abdominal pain, anal bleeding, blood in stool, constipation, diarrhea, nausea, rectal pain and vomiting.  Genitourinary:  Negative for dysuria.  Musculoskeletal:  Negative for back pain.  Skin:  Negative for rash.  Neurological:  Negative for weakness.  All other systems reviewed and are negative.     Physical Exam: There were no vitals filed for this visit.  There is no height or weight on file to calculate BMI. General: well-appearing ***  Eyes: sclera anicteric, no redness ENT: oral mucosa moist without lesions, no cervical or supraclavicular lymphadenopathy CV: RRR, no JVD, no peripheral edema Resp: clear to auscultation bilaterally, normal RR and effort noted GI: soft, no tenderness, with active bowel sounds. No guarding or palpable organomegaly noted. Skin; warm and dry, no rash or jaundice noted Neuro: awake, alert and oriented x 3. Normal gross motor function and fluent speech   Data Reviewed:  Reviewed labs, radiology imaging, old records and pertinent past GI work up   Assessment and Plan/Recommendations:  47 year old very pleasant gentleman with history of ulcerative rectosigmoiditis, intermittent mild symptoms with rectal bleeding He remains reluctant to use any oral medications including mesalamine,  or immunosuppressive agents  Reviewed recent labs, inflammatory markers were low, will recheck ESR and CRP, if continues to remain low plan to monitor  Use Canasa suppositories as needed Advised patient to avoid highly processed foods, add variety with different fruits and vegetables and switch up fermented foods, avoid drinking kombucha every day or taking probiotics  IBD health maintenance: Follow-up B12 and folate level  Return in 3 months   The patient was provided an opportunity to ask questions and all were answered. The patient agreed with the plan and demonstrated an understanding of the instructions.  Damaris Hippo , MD    CC: Vivi Barrack, MD   Joretta Bachelor Johnston Ebbs as a scribe for Harl Bowie, MD.,have documented all relevant documentation on the behalf of Harl Bowie, MD,as directed by  Harl Bowie, MD while in the presence of Harl Bowie, MD.  I, Harl Bowie, MD, have reviewed all documentation for this visit. The documentation on 06/18/22 for the exam, diagnosis, procedures, and orders are all accurate and complete.

## 2022-06-19 ENCOUNTER — Ambulatory Visit: Payer: 59 | Admitting: Gastroenterology

## 2022-08-21 ENCOUNTER — Ambulatory Visit (INDEPENDENT_AMBULATORY_CARE_PROVIDER_SITE_OTHER): Payer: 59 | Admitting: Family Medicine

## 2022-08-21 ENCOUNTER — Encounter: Payer: Self-pay | Admitting: Family Medicine

## 2022-08-21 VITALS — BP 119/79 | HR 69 | Temp 97.7°F | Ht 75.0 in | Wt 244.8 lb

## 2022-08-21 DIAGNOSIS — Z8042 Family history of malignant neoplasm of prostate: Secondary | ICD-10-CM

## 2022-08-21 DIAGNOSIS — Z1159 Encounter for screening for other viral diseases: Secondary | ICD-10-CM | POA: Diagnosis not present

## 2022-08-21 DIAGNOSIS — E785 Hyperlipidemia, unspecified: Secondary | ICD-10-CM | POA: Diagnosis not present

## 2022-08-21 DIAGNOSIS — Z114 Encounter for screening for human immunodeficiency virus [HIV]: Secondary | ICD-10-CM

## 2022-08-21 DIAGNOSIS — Z0001 Encounter for general adult medical examination with abnormal findings: Secondary | ICD-10-CM | POA: Diagnosis not present

## 2022-08-21 DIAGNOSIS — K51911 Ulcerative colitis, unspecified with rectal bleeding: Secondary | ICD-10-CM

## 2022-08-21 DIAGNOSIS — F439 Reaction to severe stress, unspecified: Secondary | ICD-10-CM

## 2022-08-21 LAB — COMPREHENSIVE METABOLIC PANEL
ALT: 37 U/L (ref 0–53)
AST: 41 U/L — ABNORMAL HIGH (ref 0–37)
Albumin: 4.1 g/dL (ref 3.5–5.2)
Alkaline Phosphatase: 77 U/L (ref 39–117)
BUN: 13 mg/dL (ref 6–23)
CO2: 26 mEq/L (ref 19–32)
Calcium: 9.2 mg/dL (ref 8.4–10.5)
Chloride: 105 mEq/L (ref 96–112)
Creatinine, Ser: 1.4 mg/dL (ref 0.40–1.50)
GFR: 60.89 mL/min (ref >60.00)
Glucose, Bld: 78 mg/dL (ref 70–99)
Potassium: 4.1 mEq/L (ref 3.5–5.1)
Sodium: 139 mEq/L (ref 135–145)
Total Bilirubin: 0.7 mg/dL (ref 0.2–1.2)
Total Protein: 7 g/dL (ref 6.0–8.3)

## 2022-08-21 LAB — CBC
HCT: 44.3 % (ref 39.0–52.0)
Hemoglobin: 15.1 g/dL (ref 13.0–17.0)
MCHC: 34 g/dL (ref 30.0–36.0)
MCV: 90 fl (ref 78.0–100.0)
Platelets: 239 10*3/uL (ref 150.0–400.0)
RBC: 4.92 Mil/uL (ref 4.22–5.81)
RDW: 13 % (ref 11.5–15.5)
WBC: 5.5 10*3/uL (ref 4.0–10.5)

## 2022-08-21 LAB — LIPID PANEL
Cholesterol: 177 mg/dL (ref 0–200)
HDL: 55.2 mg/dL (ref >39.00)
LDL Cholesterol: 105 mg/dL — ABNORMAL HIGH (ref 0–99)
NonHDL: 121.73
Total CHOL/HDL Ratio: 3
Triglycerides: 83 mg/dL (ref 0.0–149.0)
VLDL: 16.6 mg/dL (ref 0.0–40.0)

## 2022-08-21 LAB — TSH: TSH: 2.62 u[IU]/mL (ref 0.35–5.50)

## 2022-08-21 LAB — TESTOSTERONE: Testosterone: 378.34 ng/dL (ref 300.00–890.00)

## 2022-08-21 LAB — HEMOGLOBIN A1C: Hgb A1c MFr Bld: 5.6 % (ref 4.6–6.5)

## 2022-08-21 LAB — PSA: PSA: 1.54 ng/mL (ref 0.10–4.00)

## 2022-08-21 NOTE — Assessment & Plan Note (Signed)
Check lipids. Discussed lifestyle modifications.  

## 2022-08-21 NOTE — Assessment & Plan Note (Signed)
Check PSA. ?

## 2022-08-21 NOTE — Patient Instructions (Signed)
It was very nice to see you today!  We will check blood work today.  Please keep up the good work with diet and exercise.   Return in about 1 year (around 08/21/2023) for Annual Physical.   Take care, Dr [name]  PLEASE NOTE:  If you had any lab tests, please let us know if you have not heard back within a few days. You may see your results on mychart before we have a chance to review them but we will give you a call once they are reviewed by [place].   If we ordered any referrals today, please let us know if you have not heard from their office within the next week.   If you had any urgent prescriptions sent in today, please check with the pharmacy within an hour of our visit to make sure the prescription was transmitted appropriately.   Please try these tips to maintain a healthy lifestyle:  Eat at least 3 REAL meals and 1-2 snacks per day.  Aim for no more than 5 hours between eating.  If you eat breakfast, please do so within one hour of getting up.   Each meal should contain half fruits/vegetables, one quarter protein, and one quarter carbs (no bigger than a computer mouse)  Cut down on sweet beverages. This includes juice, soda, and sweet tea.   Drink at least 1 glass of water with each meal and aim for at least 8 glasses per day  Exercise at least 150 minutes every week.    Preventive Care 47-23 Years Old, Male Preventive care refers to lifestyle choices and visits with your health care provider that can promote health and wellness. Preventive care visits are also called wellness exams. What can I expect for my preventive care visit? Counseling During your preventive care visit, your health care provider may ask about your: Medical history, including: Past medical problems. Family medical history. Current health, including: Emotional well-being. Home life and relationship well-being. Sexual activity. Lifestyle, including: Alcohol, nicotine or tobacco, and drug use. Access  to firearms. Diet, exercise, and sleep habits. Safety issues such as seatbelt and bike helmet use. Sunscreen use. Work and work Astronomer. Physical exam Your health care provider will check your: Height and weight. These may be used to calculate your BMI (body mass index). BMI is a measurement that tells if you are at a healthy weight. Waist circumference. This measures the distance around your waistline. This measurement also tells if you are at a healthy weight and may help predict your risk of certain diseases, such as type 2 diabetes and high blood pressure. Heart rate and blood pressure. Body temperature. Skin for abnormal spots. What immunizations do I need?  Vaccines are usually given at various ages, according to a schedule. Your health care provider will recommend vaccines for you based on your age, medical history, and lifestyle or other factors, such as travel or where you work. What tests do I need? Screening Your health care provider may recommend screening tests for certain conditions. This may include: Lipid and cholesterol levels. Diabetes screening. This is done by checking your blood sugar (glucose) after you have not eaten for a while (fasting). Hepatitis B test. Hepatitis C test. HIV (human immunodeficiency virus) test. STI (sexually transmitted infection) testing, if you are at risk. Lung cancer screening. Prostate cancer screening. Colorectal cancer screening. Talk with your health care provider about your test results, treatment options, and if necessary, the need for more tests. Follow these instructions at  home: Eating and drinking  Eat a diet that includes fresh fruits and vegetables, whole grains, lean protein, and low-fat dairy products. Take vitamin and mineral supplements as recommended by your health care provider. Do not drink alcohol if your health care provider tells you not to drink. If you drink alcohol: Limit how much you have to 0-2 drinks a  day. Know how much alcohol is in your drink. In the [place] one drink equals one 12 oz bottle of beer (355 mL), one 5 oz glass of wine (148 mL), or one 1 oz glass of hard liquor (44 mL). Lifestyle Brush your teeth every morning and night with fluoride toothpaste. Floss one time each day. Exercise for at least 30 minutes 5 or more days each week. Do not use any products that contain nicotine or tobacco. These products include cigarettes, chewing tobacco, and vaping devices, such as e-cigarettes. If you need help quitting, ask your health care provider. Do not use drugs. If you are sexually active, practice safe sex. Use a condom or other form of protection to prevent STIs. Take aspirin only as told by your health care provider. Make sure that you understand how much to take and what form to take. Work with your health care provider to find out whether it is safe and beneficial for you to take aspirin daily. Find healthy ways to manage stress, such as: Meditation, yoga, or listening to music. Journaling. Talking to a trusted person. Spending time with friends and family. Minimize exposure to UV radiation to reduce your risk of skin cancer. Safety Always wear your seat belt while driving or riding in a vehicle. Do not drive: If you have been drinking alcohol. Do not ride with someone who has been drinking. When you are tired or distracted. While texting. If you have been using any mind-altering substances or drugs. Wear a helmet and other protective equipment during sports activities. If you have firearms in your house, make sure you follow all gun safety procedures. What's next? Go to your health care provider once a year for an annual wellness visit. Ask your health care provider how often you should have your eyes and teeth checked. Stay up to date on all vaccines. This information is not intended to replace advice given to you by your health care provider. Make sure you discuss any  questions you have with your health care provider. Document Revised: 10/04/2020 Document Reviewed: 10/04/2020 Elsevier Patient Education  2023 [employer].

## 2022-08-21 NOTE — Assessment & Plan Note (Signed)
Continue management per GI.  

## 2022-08-21 NOTE — Assessment & Plan Note (Signed)
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  Doing yoga.  He will let us know if he would like to see an different therapist.

## 2022-08-21 NOTE — Progress Notes (Signed)
Chief Complaint:  Matthew Holt is a 47 y.o. male who presents today for his annual comprehensive physical exam.    Assessment/Plan:  Chronic Problems Addressed Today: Family history of prostate cancer Check PSA.  Dyslipidemia Check lipids.  Discussed lifestyle modifications.  Ulcerative colitis with rectal bleeding (HCC) Continue management per GI.   Stress 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  Doing yoga.  He will let us know if he would like to see an different therapist.   Preventative Healthcare: Check labs.   Patient Counseling(The following topics were reviewed and/or handout was given):  -Nutrition: Stressed importance of moderation in sodium/caffeine intake, saturated fat and cholesterol, caloric balance, sufficient intake of fresh fruits, vegetables, and fiber.  -Stressed the importance of regular exercise.   -Substance Abuse: Discussed cessation/primary prevention of tobacco, alcohol, or other drug use; driving or other dangerous activities under the influence; availability of treatment for abuse.   -Injury prevention: Discussed safety belts, safety helmets, smoke detector, smoking near bedding or upholstery.   -Sexuality: Discussed sexually transmitted diseases, partner selection, use of condoms, avoidance of unintended pregnancy and contraceptive alternatives.   -Dental health: Discussed importance of regular tooth brushing, flossing, and dental visits.  -Health maintenance and immunizations reviewed. Please refer to Health maintenance section.  Return to care in 1 year for next preventative visit.     Subjective:  HPI:  He has no acute complaints today. He is up up about 20lbs since the last few months. He has been taking more creatine.  He is concerned about body fat increase he has been doing more weight training and thinks he has more muscle mass as well.  He has also been under more stress recently.  He has been managing this by  seeing a counselor okay and exercise.   Lifestyle Diet: Vegetarian and seafood. Some dairy.  Exercise: Goes to gym routinely. Doing weight HIIT training.      08/21/2022    8:23 AM  Depression screen PHQ 2/9  Decreased Interest 0  Down, Depressed, Hopeless 0  PHQ - 2 Score 0    Health Maintenance Due  Topic Date Due   HIV Screening  Never done   Hepatitis C Screening  Never done   DTaP/Tdap/Td (1 - Tdap) Never done     ROS: Per HPI, otherwise a complete review of systems was negative.   PMH:  The following were reviewed and entered/updated in epic: Past Medical History:  Diagnosis Date   Arthritis    Colitis    Dyslipidemia 09/05/2020   Ulcerative colitis with rectal bleeding [facility])    Patient Active Problem List   Diagnosis Date Noted   Family history of prostate cancer 08/21/2022   Stress 08/21/2022   Dyslipidemia 09/05/2020   Ulcerative colitis with rectal bleeding [facility])    Past Surgical History:  Procedure Laterality Date   COLONOSCOPY     WISDOM TOOTH EXTRACTION      Family History  Problem Relation Age of Onset   Prostate cancer Father    Skin cancer Father    Heart disease Maternal Grandfather    Colon cancer Neg Hx    Esophageal cancer Neg Hx    Stomach cancer Neg Hx    Pancreatic cancer Neg Hx    Liver disease Neg Hx     Medications- reviewed and updated Current Outpatient Medications  Medication Sig Dispense Refill   Ascorbic Acid (VITAMIN C) 1000 MG tablet Take 1,000 mg by mouth daily.  Multiple Vitamins-Minerals (MULTIVITAMIN WITH MINERALS) tablet Take 1 tablet by mouth daily.     Omega-3 Fatty Acids (FISH OIL PO) Take 1 tablet by mouth daily.     S-Adenosylmethionine (SAM-E) 400 MG TABS Take by mouth.     No current facility-administered medications for this visit.    Allergies-reviewed and updated Allergies  Allergen Reactions   Azithromycin Rash    Social History   Socioeconomic History   Marital status: Married    Spouse  name: Not on file   Number of children: Not on file   Years of education: Not on file   Highest education level: Not on file  Occupational History   Not on file  Tobacco Use   Smoking status: Light Smoker    Types: Cigars   Smokeless tobacco: Never   Tobacco comments:    cigars on occasion  Vaping Use   Vaping Use: Never used  Substance and Sexual Activity   Alcohol use: Yes    Alcohol/week: 5.0 - 6.0 standard drinks of alcohol    Types: 3 - 4 Cans of beer, 2 Standard drinks or equivalent per week    Comment: 2x a week   Drug use: No   Sexual activity: Not on file  Other Topics Concern   Not on file  Social History Narrative   Not on file   Social Determinants of Health   Financial Resource Strain: Not on file  Food Insecurity: Not on file  Transportation Needs: Not on file  Physical Activity: Not on file  Stress: Not on file  Social Connections: Not on file        Objective:  Physical Exam: BP 119/79   Pulse 69   Temp 97.7 F (36.5 C) (Temporal)   Ht 6\' 3"  (1.905 m)   Wt 244 lb 12.8 oz (111 kg)   SpO2 98%   BMI 30.60 kg/m   Body mass index is 30.6 kg/m. Wt Readings from Last 3 Encounters:  08/21/22 244 lb 12.8 oz (111 kg)  04/03/22 226 lb (102.5 kg)  03/04/22 226 lb (102.5 kg)   Gen: NAD, resting comfortably HEENT: TMs normal bilaterally. OP clear. No thyromegaly noted.  CV: RRR with no murmurs appreciated Pulm: NWOB, CTAB with no crackles, wheezes, or rhonchi GI: Normal bowel sounds present. Soft, Nontender, Nondistended. MSK: no edema, cyanosis, or clubbing noted Skin: warm, dry Neuro: CN2-12 grossly intact. Strength 5/5 in upper and lower extremities. Reflexes symmetric and intact bilaterally.  Psych: Normal affect and thought content     Nuriya Stuck M. [name], MD 08/21/2022 9:04 AM

## 2022-08-22 LAB — HEPATITIS C ANTIBODY: Hepatitis C Ab: NONREACTIVE

## 2022-08-22 LAB — HIV ANTIBODY (ROUTINE TESTING W REFLEX): HIV 1&2 Ab, 4th Generation: NONREACTIVE

## 2022-08-23 ENCOUNTER — Other Ambulatory Visit: Payer: Self-pay | Admitting: *Deleted

## 2022-08-23 DIAGNOSIS — R748 Abnormal levels of other serum enzymes: Secondary | ICD-10-CM

## 2022-08-23 NOTE — Progress Notes (Signed)
Cholesterol is a bit better than last year.  He should keep up the great work with diet and exercise.  We can recheck in a year or so.  One of his liver numbers is up a little bit.  He should stay away from Tylenol and alcohol and come back in a few weeks to recheck.  Please place future order for CMET.  The rest of the labs are all stable and we can recheck everything next year.  Matthew Holt. [name], MD 08/23/2022 9:56 AM

## 2022-09-02 ENCOUNTER — Other Ambulatory Visit (INDEPENDENT_AMBULATORY_CARE_PROVIDER_SITE_OTHER): Payer: 59

## 2022-09-02 DIAGNOSIS — R748 Abnormal levels of other serum enzymes: Secondary | ICD-10-CM | POA: Diagnosis not present

## 2022-09-02 LAB — COMPREHENSIVE METABOLIC PANEL
ALT: 39 U/L (ref 0–53)
AST: 50 U/L — ABNORMAL HIGH (ref 0–37)
Albumin: 4.2 g/dL (ref 3.5–5.2)
Alkaline Phosphatase: 86 U/L (ref 39–117)
BUN: 13 mg/dL (ref 6–23)
CO2: 29 mEq/L (ref 19–32)
Calcium: 9.6 mg/dL (ref 8.4–10.5)
Chloride: 103 mEq/L (ref 96–112)
Creatinine, Ser: 1.44 mg/dL (ref 0.40–1.50)
GFR: 58.86 mL/min — ABNORMAL LOW (ref >60.00)
Glucose, Bld: 75 mg/dL (ref 70–99)
Potassium: 4.1 mEq/L (ref 3.5–5.1)
Sodium: 139 mEq/L (ref 135–145)
Total Bilirubin: 1.1 mg/dL (ref 0.2–1.2)
Total Protein: 7.1 g/dL (ref 6.0–8.3)

## 2022-09-03 NOTE — Progress Notes (Signed)
Liver numbers still elevated.  He probably has some mild fatty liver however we need to check an ultrasound to confirm and rule out any other possible causes.  Please order RUQ ultrasound.  Katina Degree. [name], MD 09/03/2022 3:05 PM

## 2022-09-04 ENCOUNTER — Other Ambulatory Visit: Payer: Self-pay

## 2022-09-04 DIAGNOSIS — R748 Abnormal levels of other serum enzymes: Secondary | ICD-10-CM

## 2022-09-06 NOTE — Telephone Encounter (Signed)
Pt states he cancelled his [place] appt because his portion of the bill would be over 700 dollars. He does not want to pay that and would like a call back to talk about other options. Please advise.

## 2022-09-07 ENCOUNTER — Ambulatory Visit (HOSPITAL_BASED_OUTPATIENT_CLINIC_OR_DEPARTMENT_OTHER): Payer: 59

## 2022-09-09 NOTE — Telephone Encounter (Signed)
See note

## 2022-09-10 NOTE — Telephone Encounter (Signed)
We can recheck liver numbers again in a month or two to make sure they are stable if he does not want to get the ultrasound.  Katina Degree. [name], MD 09/10/2022 1:24 PM

## 2022-09-11 ENCOUNTER — Telehealth: Payer: Self-pay | Admitting: Family Medicine

## 2022-09-11 NOTE — Telephone Encounter (Signed)
Pt would like a call back about a sonogram. Please advise.

## 2022-09-17 NOTE — Telephone Encounter (Signed)
Matthew Holt, referral coordinator spoke with patient. Patient had and appt on 09/06/2022 cancelled due to prices  Patient will call back with [place] location of his choice

## 2022-09-17 NOTE — Telephone Encounter (Signed)
Left message to return call to our office at their convenience.  

## 2022-09-17 NOTE — Telephone Encounter (Signed)
I think getting the ultrasound is a good idea. Need to make sure there are no gallstones or signs of infection or inflammation.  Katina Degree. [name], MD 09/17/2022 8:41 AM

## 2022-09-17 NOTE — Telephone Encounter (Signed)
Patient will call back with information where he decided to go for [place]

## 2022-10-11 ENCOUNTER — Ambulatory Visit: Payer: 59 | Admitting: Family Medicine

## 2022-10-14 ENCOUNTER — Encounter: Payer: Self-pay | Admitting: Family Medicine

## 2022-10-14 ENCOUNTER — Ambulatory Visit: Payer: 59 | Admitting: Family Medicine

## 2022-10-14 VITALS — BP 119/79 | HR 65 | Temp 98.0°F | Ht 75.0 in | Wt 242.0 lb

## 2022-10-14 DIAGNOSIS — K51911 Ulcerative colitis, unspecified with rectal bleeding: Secondary | ICD-10-CM

## 2022-10-14 DIAGNOSIS — E785 Hyperlipidemia, unspecified: Secondary | ICD-10-CM | POA: Diagnosis not present

## 2022-10-14 DIAGNOSIS — R7401 Elevation of levels of liver transaminase levels: Secondary | ICD-10-CM

## 2022-10-14 DIAGNOSIS — R1011 Right upper quadrant pain: Secondary | ICD-10-CM | POA: Diagnosis not present

## 2022-10-14 LAB — COMPREHENSIVE METABOLIC PANEL
ALT: 41 U/L (ref 0–53)
AST: 42 U/L — ABNORMAL HIGH (ref 0–37)
Albumin: 4.1 g/dL (ref 3.5–5.2)
Alkaline Phosphatase: 77 U/L (ref 39–117)
BUN: 10 mg/dL (ref 6–23)
CO2: 29 mEq/L (ref 19–32)
Calcium: 9.4 mg/dL (ref 8.4–10.5)
Chloride: 106 mEq/L (ref 96–112)
Creatinine, Ser: 1.29 mg/dL (ref 0.40–1.50)
GFR: 67.11 mL/min (ref >60.00)
Glucose, Bld: 84 mg/dL (ref 70–99)
Potassium: 3.7 mEq/L (ref 3.5–5.1)
Sodium: 140 mEq/L (ref 135–145)
Total Bilirubin: 0.9 mg/dL (ref 0.2–1.2)
Total Protein: 7.2 g/dL (ref 6.0–8.3)

## 2022-10-14 NOTE — Assessment & Plan Note (Signed)
Follows with GI.  This has been well-controlled via diet.  No recent flares.  Could be contributing to above symptoms.

## 2022-10-14 NOTE — Assessment & Plan Note (Signed)
LDL 105 on most recent blood work.  Discussed lifestyle modifications.

## 2022-10-14 NOTE — Progress Notes (Signed)
   Matthew Holt is a 47 y.o. male who presents today for an office visit.  Assessment/Plan:  New/Acute Problems: AST Elevation / RUQ Pain No red flags and reassuring exam.  Several possibilities for ST elevation including vigorous exercise prior to lab draw, alcohol use, Nonalcoholic Fatty Liver Disease (NAFLD), and gallbladder disease.  He also does have history of ulcerative colitis which may be contributing as well although this has been well-controlled.  We will recheck c-Met today.  He is agreeable to pursue imaging at this point.  Due to cost constraints of ultrasound, he is interested in exploring other imaging options.  We will try CT scan with contrast to see if this is more affordable though did discuss with patient that this may miss small gallstones.  If CT scan is not any more affordable that he will go forward with right upper quadrant ultrasound.  If imaging is negative and transaminase elevation persist would have him follow back up with gastroenterology.  Discussed reasons to return to care or seek emergent care.  Chronic Problems Addressed Today: Ulcerative colitis with rectal bleeding (HCC) Follows with GI.  This has been well-controlled via diet.  No recent flares.  Could be contributing to above symptoms.  Dyslipidemia LDL 105 on most recent blood work.  Discussed lifestyle modifications.     Subjective:  HPI:  See A/P for status of chronic conditions.  Patient here today for follow-up.  Seen about 6 weeks ago for annual physical.  Labs notable for slightly elevated AST.  Confirmed elevated with AST of 50 on recheck.  Recommend right upper quadrant ultrasound however he deferred this due to financial concerns.  He has also been having some pain in his right upper quadrant. This as been intermittent in nature. Seems to happen intermittently.  Symptoms may be a little bit worse after drinking soda or beer.  No nausea or vomiting.  No constipation or diarrhea.  No melena or  hematochezia.  Ulcerative colitis has been well-controlled.       Objective:  Physical Exam: BP 119/79   Pulse 65   Temp 98 F (36.7 C) (Temporal)   Ht 6\' 3"  (1.905 m)   Wt 242 lb (109.8 kg)   SpO2 98%   BMI 30.25 kg/m   Gen: No acute distress, resting comfortably CV: Regular rate and rhythm with no murmurs appreciated Pulm: Normal work of breathing, clear to auscultation bilaterally with no crackles, wheezes, or rhonchi Abdoment: Bowel sounds present, soft, nontender, nondistended. Neuro: Grossly normal, moves all extremities Psych: Normal affect and thought content      Eleonore Shippee M. [name], MD 10/14/2022 7:53 AM

## 2022-10-14 NOTE — Patient Instructions (Signed)
It was very nice to see you today!  We will check blood work today.  Will also check a CT scan.  Return if symptoms worsen or fail to improve.   Take care, Dr [name]  PLEASE NOTE:  If you had any lab tests, please let us know if you have not heard back within a few days. You may see your results on mychart before we have a chance to review them but we will give you a call once they are reviewed by [place].   If we ordered any referrals today, please let us know if you have not heard from their office within the next week.   If you had any urgent prescriptions sent in today, please check with the pharmacy within an hour of our visit to make sure the prescription was transmitted appropriately.   Please try these tips to maintain a healthy lifestyle:  Eat at least 3 REAL meals and 1-2 snacks per day.  Aim for no more than 5 hours between eating.  If you eat breakfast, please do so within one hour of getting up.   Each meal should contain half fruits/vegetables, one quarter protein, and one quarter carbs (no bigger than a computer mouse)  Cut down on sweet beverages. This includes juice, soda, and sweet tea.   Drink at least 1 glass of water with each meal and aim for at least 8 glasses per day  Exercise at least 150 minutes every week.

## 2022-10-15 NOTE — Progress Notes (Signed)
His AST improved slightly to 42 however this is still above range.  As we discussed at his office visit I think that this is probably a benign elevation however we can double check the ultrasound as we discussed at his office visit.  Matthew Holt. [name], MD 10/15/2022 1:06 PM

## 2022-10-28 ENCOUNTER — Encounter (HOSPITAL_BASED_OUTPATIENT_CLINIC_OR_DEPARTMENT_OTHER): Payer: Self-pay

## 2022-10-28 ENCOUNTER — Ambulatory Visit (HOSPITAL_BASED_OUTPATIENT_CLINIC_OR_DEPARTMENT_OTHER)
Admission: RE | Admit: 2022-10-28 | Discharge: 2022-10-28 | Disposition: A | Discharge status: Home / Self Care | Payer: 59 | Source: Ambulatory Visit | Attending: Family Medicine | Admitting: Family Medicine

## 2022-10-28 DIAGNOSIS — E785 Hyperlipidemia, unspecified: Secondary | ICD-10-CM | POA: Diagnosis present

## 2022-10-28 DIAGNOSIS — K51911 Ulcerative colitis, unspecified with rectal bleeding: Secondary | ICD-10-CM | POA: Insufficient documentation

## 2022-10-28 DIAGNOSIS — O99891 Other specified diseases and conditions complicating pregnancy: Secondary | ICD-10-CM | POA: Insufficient documentation

## 2022-10-28 DIAGNOSIS — R7401 Elevation of levels of liver transaminase levels: Secondary | ICD-10-CM | POA: Diagnosis present

## 2022-10-28 DIAGNOSIS — R1011 Right upper quadrant pain: Secondary | ICD-10-CM | POA: Insufficient documentation

## 2022-10-28 MED ORDER — IOHEXOL 300 MG/ML  SOLN
100.0000 mL | Freq: Once | INTRAMUSCULAR | Status: AC | PRN
  Administered 2022-10-28: 85 mL via INTRAVENOUS

## 2022-10-29 NOTE — Progress Notes (Signed)
His CT scan showed normal liver, gallbladder, and pancreas.Do not need to do any further testing at this point. His small elevation in AST It is likely benign.  We can recheck labs again in 6 to 12 months.  He was found to have a small kidney stone on the left.  This should not cause him any issues  He is also noted to have some degenerative changes in his low back.  This should not cause him any issues either.

## 2023-03-17 ENCOUNTER — Encounter: Payer: Self-pay | Admitting: Family

## 2023-03-17 ENCOUNTER — Ambulatory Visit: Payer: 59 | Admitting: Family

## 2023-03-17 VITALS — BP 118/75 | HR 67 | Temp 97.8°F | Ht 75.0 in | Wt 239.2 lb

## 2023-03-17 DIAGNOSIS — H5789 Other specified disorders of eye and adnexa: Secondary | ICD-10-CM

## 2023-03-17 MED ORDER — AZELASTINE HCL 0.05 % OP SOLN
1.0000 [drp] | Freq: Two times a day (BID) | OPHTHALMIC | 0 refills | Status: AC
Start: 2023-03-17 — End: 2023-03-24

## 2023-03-17 NOTE — Progress Notes (Signed)
Patient ID: Matthew Holt, male    DOB: 05-15-1975, 47 y.o.   MRN: [id number]  Chief Complaint  Patient presents with   Sinus Problem    Pt c/o sinus facial swelling on right side, redness and slight pain. Present for about a week.     Discussed the use of AI scribe software for clinical note transcription with the patient, who gave verbal consent to proceed.  History of Present Illness   The patient presents with a week and a half history of right facial swelling, initially presenting as a 'puffy' area extending from the right eye to the cheek. The swelling was associated with a 'little red mark' and was tender to touch. The patient denies any trauma to the area and reports no significant redness or sinus symptoms such as drainage or congestion. He also reports a sensation of needle poking in the area prior to the onset of the swelling. The patient has been using a nasal spray, Sinex, for approximately two months, mainly in the mornings, to alleviate nasal congestion. He stopped using the nasal spray when the swelling appeared. The patient also reports occasional itching in the right eye.     Assessment & Plan:     Right Periorbital Swelling - Swelling and tenderness around the right eye for approximately 1.5 weeks. Possible history of insect bite, d/t pt noticing a red pinpoint mark next to his nose.  No significant redness, worsening pain, or increased swelling. Mild itching in the corner of the eye with swelling noted in inside corner conjunctiva. -Continue to hold the Sinex nasal spray for the next week. -Start saline nasal spray, using tid prn for moisture and disinfecting. -Apply ice to the area as needed for up to 10 minutes tid prn. -Consider taking over-the-counter antihistamine for a few days, Zyrtec or Claritin or generic. -Start antihistamine eye drops, Astelin, twice daily in the right eye. -Contact office if symptoms worsen.     Subjective:    Outpatient Medications Prior  to Visit  Medication Sig Dispense Refill   Ascorbic Acid (VITAMIN C) 1000 MG tablet Take 1,000 mg by mouth daily.     Multiple Vitamins-Minerals (MULTIVITAMIN WITH MINERALS) tablet Take 1 tablet by mouth daily.     Omega-3 Fatty Acids (FISH OIL PO) Take 1 tablet by mouth daily.     S-Adenosylmethionine (SAM-E) 400 MG TABS Take by mouth.     No facility-administered medications prior to visit.   Past Medical History:  Diagnosis Date   Arthritis    Colitis    Dyslipidemia 09/05/2020   Ulcerative colitis with rectal bleeding [facility])    Past Surgical History:  Procedure Laterality Date   COLONOSCOPY     WISDOM TOOTH EXTRACTION     Allergies  Allergen Reactions   Azithromycin Rash      Objective:    Physical Exam Vitals and nursing note reviewed.  Constitutional:      General: He is not in acute distress.    Appearance: Normal appearance.  HENT:     Head: Normocephalic. No right periorbital erythema.     Comments: mild tissue swelling noted on right side of nose, approx. 3 cm area, no erythema    Right Ear: Tympanic membrane and ear canal normal.     Left Ear: Tympanic membrane and ear canal normal.     Nose: No signs of injury, nasal tenderness or congestion.     Right Nostril: No epistaxis or septal hematoma.  Right Sinus: No maxillary sinus tenderness or frontal sinus tenderness.     Left Sinus: No maxillary sinus tenderness or frontal sinus tenderness.     Mouth/Throat:     Mouth: Mucous membranes are moist.     Pharynx: No pharyngeal swelling, oropharyngeal exudate or posterior oropharyngeal erythema.  Eyes:     Extraocular Movements: Extraocular movements intact.     Conjunctiva/sclera: Conjunctivae normal.     Comments: mild swelling noted on right inner corner conjunctiva  Cardiovascular:     Rate and Rhythm: Normal rate and regular rhythm.  Pulmonary:     Effort: Pulmonary effort is normal.     Breath sounds: Normal breath sounds.  Musculoskeletal:         General: Normal range of motion.     Cervical back: Normal range of motion.  Lymphadenopathy:     Head:     Right side of head: No preauricular or posterior auricular adenopathy.     Left side of head: No preauricular or posterior auricular adenopathy.     Cervical: No cervical adenopathy.  Skin:    General: Skin is warm and dry.  Neurological:     Mental Status: He is alert and oriented to person, place, and time.  Psychiatric:        Mood and Affect: Mood normal.    BP 118/75 (BP Location: Left Arm, Patient Position: Sitting, Cuff Size: Large)   Pulse 67   Temp 97.8 F (36.6 C) (Temporal)   Ht 6\' 3"  (1.905 m)   Wt 239 lb 4 oz (108.5 kg)   SpO2 95%   BMI 29.90 kg/m  Wt Readings from Last 3 Encounters:  03/17/23 239 lb 4 oz (108.5 kg)  10/14/22 242 lb (109.8 kg)  08/21/22 244 lb 12.8 oz (111 kg)       Dulce Sellar, NP

## 2023-06-06 ENCOUNTER — Encounter: Payer: Self-pay | Admitting: Family Medicine

## 2023-06-06 ENCOUNTER — Ambulatory Visit: Payer: 59 | Admitting: Family Medicine

## 2023-06-06 VITALS — BP 118/70 | HR 60 | Temp 97.9°F | Ht 75.0 in | Wt 242.2 lb

## 2023-06-06 DIAGNOSIS — E785 Hyperlipidemia, unspecified: Secondary | ICD-10-CM

## 2023-06-06 DIAGNOSIS — R21 Rash and other nonspecific skin eruption: Secondary | ICD-10-CM | POA: Diagnosis not present

## 2023-06-06 MED ORDER — DOXYCYCLINE HYCLATE 100 MG PO TABS
100.0000 mg | ORAL_TABLET | Freq: Two times a day (BID) | ORAL | 0 refills | Status: DC
Start: 2023-06-06 — End: 2023-08-25

## 2023-06-06 NOTE — Patient Instructions (Addendum)
It was very nice to see you today!  I think you may have a small bit of rosacea.   Please start the doxycycline.  Let us know if not improving.  Return if symptoms worsen or fail to improve.   Take care, Dr [name]  PLEASE NOTE:  If you had any lab tests, please let us know if you have not heard back within a few days. You may see your results on mychart before we have a chance to review them but we will give you a call once they are reviewed by [place].   If we ordered any referrals today, please let us know if you have not heard from their office within the next week.   If you had any urgent prescriptions sent in today, please check with the pharmacy within an hour of our visit to make sure the prescription was transmitted appropriately.   Please try these tips to maintain a healthy lifestyle:  Eat at least 3 REAL meals and 1-2 snacks per day.  Aim for no more than 5 hours between eating.  If you eat breakfast, please do so within one hour of getting up.   Each meal should contain half fruits/vegetables, one quarter protein, and one quarter carbs (no bigger than a computer mouse)  Cut down on sweet beverages. This includes juice, soda, and sweet tea.   Drink at least 1 glass of water with each meal and aim for at least 8 glasses per day  Exercise at least 150 minutes every week.

## 2023-06-06 NOTE — Progress Notes (Signed)
   Matthew Holt is a 48 y.o. male who presents today for an office visit.  Assessment/Plan:  Chronic Problems Addressed Today: Rash No red flag signs or symptoms.  This has been present for a few months.  Concern for possible rosacea given his scattered pustules and slight erythema and edema.  No signs of systemic illness.  No signs or symptoms concerning for ocular or nasal involvement.  Will empirically treat with doxycycline for 2 weeks.  If no improvement with this would consider referral to dermatology.  Dyslipidemia He will come back soon for physical.  Will check labs at that time.  We did discuss lifestyle modifications today including low-carb diet and regular exercise.  He is getting plenty of protein in his diet via plant-based sources and is now strictly vegetarian.     Subjective:  HPI:  See assessment / plan for status of chronic conditions. Patient here with swelling on the right side of his face. This has been going on for a couple of months.  Symptoms are staying about the same the last several weeks. No treatments tried. No pain. No fevers or chills. No sinus congestion or drainage. He did try using OTC nasal spray several months ago however symptoms did not improve.  Predominately located on the right side of his face.  No pain.  No flaking.  He has had a limited redness to the area.  No eye tearing or watering. Vision has been normal. No rhinorrhea. No epistaxis.        Objective:  Physical Exam: BP 118/70 (BP Location: Left Arm, Patient Position: Sitting, Cuff Size: Large)   Pulse 60   Temp 97.9 F (36.6 C) (Temporal)   Ht 6\' 3"  (1.905 m)   Wt 242 lb 4 oz (109.9 kg)   SpO2 98%   BMI 30.28 kg/m   Wt Readings from Last 3 Encounters:  06/06/23 242 lb 4 oz (109.9 kg)  03/17/23 239 lb 4 oz (108.5 kg)  10/14/22 242 lb (109.8 kg)    Gen: No acute distress, resting comfortably Skin: Right paranasal area with confluent subtly erythematous patch approximately 2 to 3  cm in diameter.  Small amount of edema noted.  Several scattered pustules noted across nasal bridge.  No flaking or scaling noted. Neuro: Grossly normal, moves all extremities Psych: Normal affect and thought content      Matthew Fahs M. [name], MD 06/06/2023 7:59 AM

## 2023-06-06 NOTE — Assessment & Plan Note (Signed)
No red flag signs or symptoms.  This has been present for a few months.  Concern for possible rosacea given his scattered pustules and slight erythema and edema.  No signs of systemic illness.  No signs or symptoms concerning for ocular or nasal involvement.  Will empirically treat with doxycycline for 2 weeks.  If no improvement with this would consider referral to dermatology.

## 2023-06-06 NOTE — Assessment & Plan Note (Signed)
He will come back soon for physical.  Will check labs at that time.  We did discuss lifestyle modifications today including low-carb diet and regular exercise.  He is getting plenty of protein in his diet via plant-based sources and is now strictly vegetarian.

## 2023-07-02 ENCOUNTER — Encounter: Payer: Self-pay | Admitting: Family Medicine

## 2023-07-03 NOTE — Telephone Encounter (Signed)
 Recommend referral to dermatology.  Katina Degree. [name], MD 07/03/2023 10:35 AM

## 2023-08-25 ENCOUNTER — Ambulatory Visit (INDEPENDENT_AMBULATORY_CARE_PROVIDER_SITE_OTHER): Payer: 59 | Admitting: Family Medicine

## 2023-08-25 ENCOUNTER — Encounter: Payer: Self-pay | Admitting: Family Medicine

## 2023-08-25 VITALS — BP 119/79 | HR 62 | Temp 97.3°F | Ht 75.0 in | Wt 241.8 lb

## 2023-08-25 DIAGNOSIS — Z0001 Encounter for general adult medical examination with abnormal findings: Secondary | ICD-10-CM

## 2023-08-25 DIAGNOSIS — Z23 Encounter for immunization: Secondary | ICD-10-CM

## 2023-08-25 DIAGNOSIS — Z8042 Family history of malignant neoplasm of prostate: Secondary | ICD-10-CM

## 2023-08-25 DIAGNOSIS — R350 Frequency of micturition: Secondary | ICD-10-CM

## 2023-08-25 DIAGNOSIS — Z131 Encounter for screening for diabetes mellitus: Secondary | ICD-10-CM

## 2023-08-25 DIAGNOSIS — K51911 Ulcerative colitis, unspecified with rectal bleeding: Secondary | ICD-10-CM

## 2023-08-25 DIAGNOSIS — E785 Hyperlipidemia, unspecified: Secondary | ICD-10-CM | POA: Diagnosis not present

## 2023-08-25 DIAGNOSIS — Z13220 Encounter for screening for lipoid disorders: Secondary | ICD-10-CM

## 2023-08-25 LAB — COMPREHENSIVE METABOLIC PANEL WITH GFR
ALT: 41 U/L (ref 0–53)
AST: 41 U/L — ABNORMAL HIGH (ref 0–37)
Albumin: 4.4 g/dL (ref 3.5–5.2)
Alkaline Phosphatase: 80 U/L (ref 39–117)
BUN: 14 mg/dL (ref 6–23)
CO2: 28 meq/L (ref 19–32)
Calcium: 9.3 mg/dL (ref 8.4–10.5)
Chloride: 103 meq/L (ref 96–112)
Creatinine, Ser: 1.34 mg/dL (ref 0.40–1.50)
GFR: 63.73 mL/min (ref >60.00)
Glucose, Bld: 81 mg/dL (ref 70–99)
Potassium: 4.1 meq/L (ref 3.5–5.1)
Sodium: 138 meq/L (ref 135–145)
Total Bilirubin: 0.7 mg/dL (ref 0.2–1.2)
Total Protein: 7.1 g/dL (ref 6.0–8.3)

## 2023-08-25 LAB — CBC
HCT: 46 % (ref 39.0–52.0)
Hemoglobin: 15.4 g/dL (ref 13.0–17.0)
MCHC: 33.5 g/dL (ref 30.0–36.0)
MCV: 91.2 fl (ref 78.0–100.0)
Platelets: 243 10*3/uL (ref 150.0–400.0)
RBC: 5.04 Mil/uL (ref 4.22–5.81)
RDW: 13.3 % (ref 11.5–15.5)
WBC: 5.3 10*3/uL (ref 4.0–10.5)

## 2023-08-25 LAB — LIPID PANEL
Cholesterol: 185 mg/dL (ref 0–200)
HDL: 56.1 mg/dL (ref >39.00)
LDL Cholesterol: 117 mg/dL — ABNORMAL HIGH (ref 0–99)
NonHDL: 128.99
Total CHOL/HDL Ratio: 3
Triglycerides: 60 mg/dL (ref 0.0–149.0)
VLDL: 12 mg/dL (ref 0.0–40.0)

## 2023-08-25 LAB — HEMOGLOBIN A1C: Hgb A1c MFr Bld: 5.7 % (ref 4.6–6.5)

## 2023-08-25 LAB — URINALYSIS, ROUTINE W REFLEX MICROSCOPIC
Bilirubin Urine: NEGATIVE
Hgb urine dipstick: NEGATIVE
Ketones, ur: NEGATIVE
Leukocytes,Ua: NEGATIVE
Nitrite: NEGATIVE
RBC / HPF: NONE SEEN (ref 0–?)
Specific Gravity, Urine: 1.01 (ref 1.000–1.030)
Total Protein, Urine: NEGATIVE
Urine Glucose: NEGATIVE
Urobilinogen, UA: 0.2 (ref 0.0–1.0)
WBC, UA: NONE SEEN (ref 0–?)
pH: 6 (ref 5.0–8.0)

## 2023-08-25 LAB — PSA: PSA: 1.72 ng/mL (ref 0.10–4.00)

## 2023-08-25 LAB — TSH: TSH: 2.29 u[IU]/mL (ref 0.35–5.50)

## 2023-08-25 NOTE — Assessment & Plan Note (Signed)
 Check PSA. ?

## 2023-08-25 NOTE — Patient Instructions (Signed)
 It was very nice to see you today!  We will check blood work today.   Please continue to work on diet and exercise.  Will get your tetanus vaccine today.  We will  check urine sample and your prostate numbers today.  Will see you back next year for your physical.  Come back sooner if needed.  Return in about 1 year (around 08/24/2024) for Annual Physical.   Take care, Dr Daneil Dunker  PLEASE NOTE:  If you had any lab tests, please let us  know if you have not heard back within a few days. You may see your results on mychart before we have a chance to review them but we will give you a call once they are reviewed by us .   If we ordered any referrals today, please let us  know if you have not heard from their office within the next week.   If you had any urgent prescriptions sent in today, please check with the pharmacy within an hour of our visit to make sure the prescription was transmitted appropriately.   Please try these tips to maintain a healthy lifestyle:  Eat at least 3 REAL meals and 1-2 snacks per day.  Aim for no more than 5 hours between eating.  If you eat breakfast, please do so within one hour of getting up.   Each meal should contain half fruits/vegetables, one quarter protein, and one quarter carbs (no bigger than a computer mouse)  Cut down on sweet beverages. This includes juice, soda, and sweet tea.   Drink at least 1 glass of water with each meal and aim for at least 8 glasses per day  Exercise at least 150 minutes every week.     Preventive Care 46-63 Years Old, Male Preventive care refers to lifestyle choices and visits with your health care provider that can promote health and wellness. Preventive care visits are also called wellness exams. What can I expect for my preventive care visit? Counseling During your preventive care visit, your health care provider may ask about your: Medical history, including: Past medical problems. Family medical  history. Current health, including: Emotional well-being. Home life and relationship well-being. Sexual activity. Lifestyle, including: Alcohol, nicotine or tobacco, and drug use. Access to firearms. Diet, exercise, and sleep habits. Safety issues such as seatbelt and bike helmet use. Sunscreen use. Work and work Astronomer. Physical exam Your health care provider will check your: Height and weight. These may be used to calculate your BMI (body mass index). BMI is a measurement that tells if you are at a healthy weight. Waist circumference. This measures the distance around your waistline. This measurement also tells if you are at a healthy weight and may help predict your risk of certain diseases, such as type 2 diabetes and high blood pressure. Heart rate and blood pressure. Body temperature. Skin for abnormal spots. What immunizations do I need?  Vaccines are usually given at various ages, according to a schedule. Your health care provider will recommend vaccines for you based on your age, medical history, and lifestyle or other factors, such as travel or where you work. What tests do I need? Screening Your health care provider may recommend screening tests for certain conditions. This may include: Lipid and cholesterol levels. Diabetes screening. This is done by checking your blood sugar (glucose) after you have not eaten for a while (fasting). Hepatitis B test. Hepatitis C test. HIV (human immunodeficiency virus) test. STI (sexually transmitted infection) testing, if you are at  risk. Lung cancer screening. Prostate cancer screening. Colorectal cancer screening. Talk with your health care provider about your test results, treatment options, and if necessary, the need for more tests. Follow these instructions at home: Eating and drinking  Eat a diet that includes fresh fruits and vegetables, whole grains, lean protein, and low-fat dairy products. Take vitamin and mineral  supplements as recommended by your health care provider. Do not drink alcohol if your health care provider tells you not to drink. If you drink alcohol: Limit how much you have to 0-2 drinks a day. Know how much alcohol is in your drink. In the [place] one drink equals one 12 oz bottle of beer (355 mL), one 5 oz glass of wine (148 mL), or one 1 oz glass of hard liquor (44 mL). Lifestyle Brush your teeth every morning and night with fluoride toothpaste. Floss one time each day. Exercise for at least 30 minutes 5 or more days each week. Do not use any products that contain nicotine or tobacco. These products include cigarettes, chewing tobacco, and vaping devices, such as e-cigarettes. If you need help quitting, ask your health care provider. Do not use drugs. If you are sexually active, practice safe sex. Use a condom or other form of protection to prevent STIs. Take aspirin only as told by your health care provider. Make sure that you understand how much to take and what form to take. Work with your health care provider to find out whether it is safe and beneficial for you to take aspirin daily. Find healthy ways to manage stress, such as: Meditation, yoga, or listening to music. Journaling. Talking to a trusted person. Spending time with friends and family. Minimize exposure to UV radiation to reduce your risk of skin cancer. Safety Always wear your seat belt while driving or riding in a vehicle. Do not drive: If you have been drinking alcohol. Do not ride with someone who has been drinking. When you are tired or distracted. While texting. If you have been using any mind-altering substances or drugs. Wear a helmet and other protective equipment during sports activities. If you have firearms in your house, make sure you follow all gun safety procedures. What's next? Go to your health care provider once a year for an annual wellness visit. Ask your health care provider how often you should  have your eyes and teeth checked. Stay up to date on all vaccines. This information is not intended to replace advice given to you by your health care provider. Make sure you discuss any questions you have with your health care provider. Document Revised: 10/04/2020 Document Reviewed: 10/04/2020 Elsevier Patient Education  2024 [employer].

## 2023-08-25 NOTE — Assessment & Plan Note (Signed)
 Follows with GI.  Symptoms have been well-controlled via diet.  Last colonoscopy was 2 years ago.

## 2023-08-25 NOTE — Assessment & Plan Note (Signed)
 Check lipids. Discussed lifestyle modifications.

## 2023-08-25 NOTE — Progress Notes (Signed)
 Chief Complaint:  Matthew Holt is a 48 y.o. male who presents today for his annual comprehensive physical exam.    Assessment/Plan:  New/Acute Problems: Urinary frequency No red flags.  May be related to polydipsia.  We did discuss reducing caffeine and alcohol intake as well though he is only doing these infrequently.  Will check UA and urine culture.  Also check PSA.  I have mild underlying OAB.  We did discuss referral to urology for further testing or trial of medication however he would like to hold off on this for now and is mostly concerned about ruling out cancer or infection.  Chronic Problems Addressed Today: Family history of prostate cancer Check PSA.  Ulcerative colitis with rectal bleeding (HCC) Follows with GI.  Symptoms have been well-controlled via diet.  Last colonoscopy was 2 years ago.  Dyslipidemia Check lipids.  Discussed lifestyle modifications.  Preventative Healthcare: Tdap given today.  Check labs.  Patient Counseling(The following topics were reviewed and/or handout was given):  -Nutrition: Stressed importance of moderation in sodium/caffeine intake, saturated fat and cholesterol, caloric balance, sufficient intake of fresh fruits, vegetables, and fiber.  -Stressed the importance of regular exercise.   -Substance Abuse: Discussed cessation/primary prevention of tobacco, alcohol, or other drug use; driving or other dangerous activities under the influence; availability of treatment for abuse.   -Injury prevention: Discussed safety belts, safety helmets, smoke detector, smoking near bedding or upholstery.   -Sexuality: Discussed sexually transmitted diseases, partner selection, use of condoms, avoidance of unintended pregnancy and contraceptive alternatives.   -Dental health: Discussed importance of regular tooth brushing, flossing, and dental visits.  -Health maintenance and immunizations reviewed. Please refer to Health maintenance section.  Return to  care in 1 year for next preventative visit.     Subjective:  HPI:  He has no acute complaints today. See Assessment / plan for status of chronic conditions.  Overall doing well today.  He has noticed increased urinary frequency last few months.  Sometimes has an urge to urinate though only a little bit will come out.  No dysuria.  No hematuria.  No fevers or chills.  He does note that he drinks a lot of water which may be contributing to his urinary frequency.   Lifestyle Diet: Balanced. Plenty of fruits and vegetables. Avoids meat.  Exercise: Goes to gym routinely. Does HIIT workout in the morning though is looking to adding CrossFit as well.     08/25/2023    8:09 AM  Depression screen PHQ 2/9  Decreased Interest 0  Down, Depressed, Hopeless 0  PHQ - 2 Score 0    Health Maintenance Due  Topic Date Due   DTaP/Tdap/Td (1 - Tdap) Never done   Pneumococcal Vaccine 40-59 Years old (1 of 2 - PCV) Never done     ROS: Per HPI, otherwise a complete review of systems was negative.   PMH:  The following were reviewed and entered/updated in epic: Past Medical History:  Diagnosis Date   Arthritis    Colitis    Dyslipidemia 09/05/2020   Ulcerative colitis with rectal bleeding [facility])    Patient Active Problem List   Diagnosis Date Noted   Rash 06/06/2023   Family history of prostate cancer 08/21/2022   Stress 08/21/2022   Dyslipidemia 09/05/2020   Ulcerative colitis with rectal bleeding [facility])    Past Surgical History:  Procedure Laterality Date   COLONOSCOPY     WISDOM TOOTH EXTRACTION      Family History  Problem Relation Age of Onset   Prostate cancer Father    Skin cancer Father    Heart disease Maternal Grandfather    Colon cancer Neg Hx    Esophageal cancer Neg Hx    Stomach cancer Neg Hx    Pancreatic cancer Neg Hx    Liver disease Neg Hx     Medications- reviewed and updated Current Outpatient Medications  Medication Sig Dispense Refill   Ascorbic Acid  (VITAMIN C) 1000 MG tablet Take 1,000 mg by mouth daily.     Multiple Vitamins-Minerals (MULTIVITAMIN WITH MINERALS) tablet Take 1 tablet by mouth daily.     Omega-3 Fatty Acids (FISH OIL PO) Take 1 tablet by mouth daily.     OVER THE COUNTER MEDICATION Take 2 each by mouth daily in the afternoon. Brain supplement Nurevia     No current facility-administered medications for this visit.    Allergies-reviewed and updated Allergies  Allergen Reactions   Azithromycin  Rash    Social History   Socioeconomic History   Marital status: Married    Spouse name: Not on file   Number of children: Not on file   Years of education: Not on file   Highest education level: Doctorate  Occupational History   Not on file  Tobacco Use   Smoking status: Light Smoker    Types: Cigars   Smokeless tobacco: Never   Tobacco comments:    cigars on occasion  Vaping Use   Vaping status: Never Used  Substance and Sexual Activity   Alcohol use: Yes    Alcohol/week: 5.0 - 6.0 standard drinks of alcohol    Types: 3 - 4 Cans of beer, 2 Standard drinks or equivalent per week    Comment: 2x a week   Drug use: No   Sexual activity: Not on file  Other Topics Concern   Not on file  Social History Narrative   Not on file   Social Drivers of Health   Financial Resource Strain: Low Risk  (10/11/2022)   Overall Financial Resource Strain (CARDIA)    Difficulty of Paying Living Expenses: Not hard at all  Food Insecurity: No Food Insecurity (10/11/2022)   Hunger Vital Sign    Worried About Running Out of Food in the Last Year: Never true    Ran Out of Food in the Last Year: Never true  Transportation Needs: No Transportation Needs (10/11/2022)   PRAPARE - Administrator, Civil Service (Medical): No    Lack of Transportation (Non-Medical): No  Physical Activity: Sufficiently Active (10/11/2022)   Exercise Vital Sign    Days of Exercise per Week: 7 days    Minutes of Exercise per Session: 70 min   Stress: No Stress Concern Present (10/11/2022)   [employer] of Occupational Health - Occupational Stress Questionnaire    Feeling of Stress : Only a little  Social Connections: Unknown (10/11/2022)   Social Connection and Isolation Panel [NHANES]    Frequency of Communication with Friends and Family: Once a week    Frequency of Social Gatherings with Friends and Family: Patient declined    Attends Religious Services: Patient declined    Database administrator or Organizations: No    Attends Engineer, structural: Not on file    Marital Status: Married        Objective:  Physical Exam: BP 119/79   Pulse 62   Temp (!) 97.3 F (36.3 C) (Temporal)   Ht 6\' 3"  (1.905  m)   Wt 241 lb 12.8 oz (109.7 kg)   SpO2 96%   BMI 30.22 kg/m   Body mass index is 30.22 kg/m. Wt Readings from Last 3 Encounters:  08/25/23 241 lb 12.8 oz (109.7 kg)  06/06/23 242 lb 4 oz (109.9 kg)  03/17/23 239 lb 4 oz (108.5 kg)   Gen: NAD, resting comfortably urinary frequency No red flags.  May be related to polydipsia.  We also did discuss importance of HEENT: TMs normal bilaterally. OP clear. No thyromegaly noted.  CV: RRR with no murmurs appreciated Pulm: NWOB, CTAB with no crackles, wheezes, or rhonchi GI: Normal bowel sounds present. Soft, Nontender, Nondistended. MSK: no edema, cyanosis, or clubbing noted Skin: warm, dry Neuro: CN2-12 grossly intact. Strength 5/5 in upper and lower extremities. Reflexes symmetric and intact bilaterally.  Psych: Normal affect and thought content     Caylan Schifano M. Daneil Dunker, MD 08/25/2023 8:45 AM

## 2023-08-25 NOTE — Addendum Note (Signed)
 Addended by: Alverda Joe on: 08/25/2023 08:49 AM   Modules accepted: Orders

## 2023-08-26 LAB — URINE CULTURE
MICRO NUMBER:: 16412976
Result:: NO GROWTH
SPECIMEN QUALITY:: ADEQUATE

## 2023-08-27 ENCOUNTER — Encounter: Payer: Self-pay | Admitting: Family Medicine

## 2023-08-27 NOTE — Progress Notes (Signed)
 His urine culture was negative for infection.  His PSA is normal.  No obvious cause for his frequent urination on the labs.  As we discussed at his visit he may have some mild overactive bladder.  We can refer him to urology if his symptoms are still bothering him more he can continue to monitor for now.  His cholesterol is up a little bit since last year.  He does not need to start meds for this however he should continue to work on diet and exercise and we can recheck this again in a year or so.  His liver numbers are stable compared to the last couple of years.  The rest of his labs are all at goal.  Do not need to make any changes to his treatment plan.  He should continue to work on diet and exercise and we can recheck again in a year or so.

## 2023-10-03 ENCOUNTER — Ambulatory Visit: Admitting: Internal Medicine

## 2023-10-03 ENCOUNTER — Ambulatory Visit: Payer: Self-pay | Admitting: *Deleted

## 2023-10-03 ENCOUNTER — Encounter: Payer: Self-pay | Admitting: Internal Medicine

## 2023-10-03 VITALS — BP 126/80 | HR 73 | Temp 97.5°F | Ht 75.0 in | Wt 241.8 lb

## 2023-10-03 DIAGNOSIS — S93691A Other sprain of right foot, initial encounter: Secondary | ICD-10-CM | POA: Diagnosis not present

## 2023-10-03 DIAGNOSIS — M629 Disorder of muscle, unspecified: Secondary | ICD-10-CM

## 2023-10-03 NOTE — Telephone Encounter (Signed)
    FYI Only or Action Required?: Action required by provider  Patient was last seen in primary care on 08/25/2023 by Rodney Clamp, MD. Called Nurse Triage reporting Foot Swelling. Symptoms began yesterday. Interventions attempted: OTC medications: ibuprofen with no relief and Ice/heat application. Symptoms are: gradually worsening.  Triage Disposition: See HCP Within 4 Hours (Or PCP Triage)  Patient/caregiver understands and will follow disposition?: Yes                    Copied from CRM [id number]. Topic: Clinical - Red Word Triage >> Oct 03, 2023  7:58 AM [place] A wrote: [facility] that prompted transfer to Nurse Triage: Swollen right foot has pain on bottom of foot. Reason for Disposition  [1] SEVERE pain (e.g., excruciating, unable to do any normal activities) AND [2] not improved after 2 hours of pain medicine  Answer Assessment - Initial Assessment Questions 1. ONSET: When did the pain start?      Last night while playing basket ball and felt pop arch of right foot 2. LOCATION: Where is the pain located?      Right foot in arch between big toe and 2nd  toe 3. PAIN: How bad is the pain?    (Scale 1-10; or mild, moderate, severe)  - MILD (1-3): doesn't interfere with normal activities.   - MODERATE (4-7): interferes with normal activities (e.g., work or school) or awakens from sleep, limping.   - SEVERE (8-10): excruciating pain, unable to do any normal activities, unable to walk.      Limping pain  4. WORK OR EXERCISE: Has there been any recent work or exercise that involved this part of the body?      Yes playing  5. CAUSE: What do you think is causing the foot pain?     Felt pop in right foot playing basketball last night 6. OTHER SYMPTOMS: Do you have any other symptoms? (e.g., leg pain, rash, fever, numbness)     Swelling bottom of foot pain between great toe and 2nd toe. 7. PREGNANCY: Is there any chance you are pregnant? When was your last  menstrual period?     Na   No available appt with PCP until next week. Scheduled appt with other provider this am .  Protocols used: Foot Pain-A-AH

## 2023-10-03 NOTE — Patient Instructions (Signed)
 A mid-plantar fascia tear, particularly in athletes experiencing pain during dorsiflexion, requires a comprehensive, evidence-based approach to treatment and rehabilitation. The plantar fascia, a thick band of tissue supporting the foot's arch, can rupture due to sudden stress, especially in high-impact sports. While rare, such injuries necessitate prompt and tailored interventions to ensure optimal recovery and return to sport.(https://kidd.biz/)  ?? Treatment Options 1. Initial Management (First 1-2 Weeks) Rest and Activity Modification: Avoid weight-bearing activities to prevent further strain on the injured fascia. Immobilization: Utilize a walking boot or short leg cast to limit movement and support healing. Ice Therapy: Apply ice packs to reduce inflammation and alleviate pain. NSAIDs: Use nonsteroidal anti-inflammatory drugs to manage pain and swelling, under medical supervision.(pmc.ncbi.nlm.nih.gov, PurpleAdvertising.no) 2. Rehabilitation Phase (Weeks 2-6) Physical Therapy: Engage in targeted exercises to restore strength and flexibility, focusing on the plantar fascia and associated structures. Stretching Protocols: Implement specific stretches for the plantar fascia and Achilles tendon to improve dorsiflexion range and reduce tension. Orthotic Support: Incorporate custom or prefabricated foot orthoses to provide arch support and alleviate stress on the fascia.(https://powers.com/) 3. Advanced Therapies (If Conservative Treatment Fails) Platelet-Rich Plasma (PRP) Injections: Administered to promote tissue healing; however, evidence on efficacy is mixed. Extracorporeal Shock Wave Therapy (ESWT): Utilizes sound waves to stimulate healing; may be considered after failure of conservative treatments. Corticosteroid Injections: May offer short-term pain relief but are used cautiously due to potential side effects.(time.com) 4. Surgical Intervention (For Severe Cases) Surgical Repair: In cases of complete  rupture, surgical intervention may be necessary to restore function. Studies have shown that athletes can return to high-demand sports within 12 months post-surgery .(ScanFund.hu)  ?? Prognosis The prognosis for athletes with a mid-plantar fascia tear is generally favorable with appropriate treatment. Most individuals can return to their pre-injury activity levels within 9 to 12 weeks. However, outcomes can vary based on the severity of the tear, adherence to rehabilitation protocols, and the presence of any complications. Early intervention and a structured rehabilitation p maybe wonder why denied I am not justrogram are crucial for optimal recovery.  ?? State-of-the-Art Considerations Biomechanical Assessment: Conduct thorough evaluations to identify any underlying gait abnormalities or structural issues that may predispose to injury. Personalized Rehabilitation Plans: Develop individualized therapy programs that address the specific needs and goals of the athlete. Advanced Imaging: Utilize MRI or ultrasound to assess the extent of the injury and monitor healing progress. Multidisciplinary Approach: Collaborate with sports medicine specialists, physical therapists, and orthopedic surgeons to provide comprehensive care.  In summary, a mid-plantar fascia tear in athletes requires a structured and evidence-based treatment approach. Early diagnosis, appropriate conservative management, and, if necessary, surgical intervention can lead to successful outcomes and a safe return to sport.(en.wikipedia.org ) So3

## 2023-10-04 NOTE — Progress Notes (Signed)
 ==============================  [facility]: [phone]   -- Medical Office Visit --  Patient: Matthew Holt      Age: 48 y.o.       Sex:  male  Date:   10/03/2023 Today's Healthcare Provider: Anthon Kins, MD  ==============================   Chief Complaint: Foot Swelling (Right foot pain foot is swelling and it pop after playing basketball.would like to see if it is broken or what is going on.)  Discussed the use of AI scribe software for clinical note transcription with the patient, who gave verbal consent to proceed.  History of Present Illness The patient is a 48 year old with plantar fasciitis who presents with right foot pain, swelling, and popping after playing basketball.  The patient experienced a popping sensation in the arch of their right foot while playing in an over-forty basketball league. This occurred during the last game of the night when they landed on their right foot. They immediately stopped playing due to the pain and have since been hobbling around.  The pain is located in the arch of the foot, with some swelling present. They have been icing the foot and taking ibuprofen for pain management. They have a history of plantar fasciitis, with soreness in the arch over the past month, for which they have been using Voltaren and stretching exercises.  The pain is exacerbated by pressure on the front of the foot, such as when driving, and they experience discomfort between the big toe and second toe. They have been using ankle braces while playing basketball due to previous ankle injuries, which they believe may contribute to the pressure on the plantar fascia. They want to return to playing basketball eventually.  No point tenderness when pressure is applied directly to the bones of the foot. Mild pain is present when the foot is dorsiflexed and when pressure is applied to the plantar fascia.   Problem List: has Ulcerative  colitis with rectal bleeding (HCC); Dyslipidemia; Family history of prostate cancer; Stress; and Rash on their problem list. Past Medical History:  has a past medical history of Arthritis, Colitis, Dyslipidemia (09/05/2020), and Ulcerative colitis with rectal bleeding (HCC). Past Surgical History:   has a past surgical history that includes Wisdom tooth extraction and Colonoscopy. Social History:   reports that he has been smoking cigars. He has never used smokeless tobacco. He reports current alcohol use of about 5.0 - 6.0 standard drinks of alcohol per week. He reports that he does not use drugs. Family History:  family history includes Heart disease in his maternal grandfather; Prostate cancer in his father; Skin cancer in his father. Allergies:  is allergic to azithromycin .   Medication Reconciliation: Current Outpatient Medications on File Prior to Visit  Medication Sig   Ascorbic Acid (VITAMIN C) 1000 MG tablet Take 1,000 mg by mouth daily.   Multiple Vitamins-Minerals (MULTIVITAMIN WITH MINERALS) tablet Take 1 tablet by mouth daily.   Omega-3 Fatty Acids (FISH OIL PO) Take 1 tablet by mouth daily.   OVER THE COUNTER MEDICATION Take 2 each by mouth daily in the afternoon. Brain supplement Nurevia   No current facility-administered medications on file prior to visit.  There are no discontinued medications.   Physical Exam:    10/03/2023    8:29 AM 08/25/2023    8:11 AM 06/06/2023    7:27 AM  Vitals with BMI  Height 6' 3 6' 3 6' 3  Weight 241 lbs 13 oz 241 lbs 13  oz 242 lbs 4 oz  BMI 30.22 30.22 30.28  Systolic 126 119 098  Diastolic 80 79 70  Pulse 73 62 60  Vital signs reviewed.  Nursing notes reviewed. Weight trend reviewed. Physical Exam General Appearance:  No acute distress appreciable.   Well-groomed, healthy-appearing male.  Well proportioned with no abnormal fat distribution.  Good muscle tone. Pulmonary:  Normal work of breathing at rest, no respiratory distress  apparent. SpO2: 98 %  Musculoskeletal: All extremities are intact.  Neurological:  Awake, alert, oriented, and engaged.  No obvious focal neurological deficits or cognitive impairments.  Sensorium seems unclouded.   Speech is clear and coherent with logical content. Psychiatric:  Appropriate mood, pleasant and cooperative demeanor, thoughtful and engaged during the exam Physical Exam MUSCULOSKELETAL: Pain in right plantar fascia on palpation. No pain on compression or palpation of right foot bones. Plantar fascial Pain on dorsiflexion of right foot, but not on plantar flexion. Significant point tenderness when not hyperextended/hyperflexed.      No results found for any visits on 10/03/23. Office Visit on 08/25/2023  Component Date Value Ref Range Status   WBC 08/25/2023 5.3  4.0 - 10.5 K/uL Final   RBC 08/25/2023 5.04  4.22 - 5.81 Mil/uL Final   Platelets 08/25/2023 243.0  150.0 - 400.0 K/uL Final   Hemoglobin 08/25/2023 15.4  13.0 - 17.0 g/dL Final   HCT 11/91/4782 46.0  39.0 - 52.0 % Final   MCV 08/25/2023 91.2  78.0 - 100.0 fl Final   MCHC 08/25/2023 33.5  30.0 - 36.0 g/dL Final   RDW 95/62/1308 13.3  11.5 - 15.5 % Final   Sodium 08/25/2023 138  135 - 145 mEq/L Final   Potassium 08/25/2023 4.1  3.5 - 5.1 mEq/L Final   Chloride 08/25/2023 103  96 - 112 mEq/L Final   CO2 08/25/2023 28  19 - 32 mEq/L Final   Glucose, Bld 08/25/2023 81  70 - 99 mg/dL Final   BUN 65/78/4696 14  6 - 23 mg/dL Final   Creatinine, Ser 08/25/2023 1.34  0.40 - 1.50 mg/dL Final   Total Bilirubin 08/25/2023 0.7  0.2 - 1.2 mg/dL Final   Alkaline Phosphatase 08/25/2023 80  39 - 117 U/L Final   AST 08/25/2023 41 (H)  0 - 37 U/L Final   ALT 08/25/2023 41  0 - 53 U/L Final   Total Protein 08/25/2023 7.1  6.0 - 8.3 g/dL Final   Albumin 29/52/8413 4.4  3.5 - 5.2 g/dL Final   GFR 24/40/1027 63.73  >60.00 mL/min Final   Calcium 08/25/2023 9.3  8.4 - 10.5 mg/dL Final   Hgb O5D MFr Bld 08/25/2023 5.7  4.6 - 6.5 %  Final   TSH 08/25/2023 2.29  0.35 - 5.50 uIU/mL Final   PSA 08/25/2023 1.72  0.10 - 4.00 ng/mL Final   Cholesterol 08/25/2023 185  0 - 200 mg/dL Final   Triglycerides 66/44/0347 60.0  0.0 - 149.0 mg/dL Final   HDL 42/59/5638 56.10  >39.00 mg/dL Final   VLDL 75/64/3329 12.0  0.0 - 40.0 mg/dL Final   LDL Cholesterol 08/25/2023 117 (H)  0 - 99 mg/dL Final   Total CHOL/HDL Ratio 08/25/2023 3   Final   NonHDL 08/25/2023 128.99   Final   Color, Urine 08/25/2023 YELLOW  Yellow;Lt. Yellow;Straw;Dark Yellow;Amber;Green;Red;Brown Final   APPearance 08/25/2023 CLEAR  Clear;Turbid;Slightly Cloudy;Cloudy Final   Specific Gravity, Urine 08/25/2023 1.010  1.000 - 1.030 Final   pH 08/25/2023 6.0  5.0 - 8.0  Final   Total Protein, Urine 08/25/2023 NEGATIVE  Negative Final   Urine Glucose 08/25/2023 NEGATIVE  Negative Final   Ketones, ur 08/25/2023 NEGATIVE  Negative Final   Bilirubin Urine 08/25/2023 NEGATIVE  Negative Final   Hgb urine dipstick 08/25/2023 NEGATIVE  Negative Final   Urobilinogen, UA 08/25/2023 0.2  0.0 - 1.0 Final   Leukocytes,Ua 08/25/2023 NEGATIVE  Negative Final   Nitrite 08/25/2023 NEGATIVE  Negative Final   WBC, UA 08/25/2023 none seen  0-2/hpf Final   RBC / HPF 08/25/2023 none seen  0-2/hpf Final   MICRO NUMBER: 08/25/2023 [id number]   Final   SPECIMEN QUALITY: 08/25/2023 Adequate   Final   Sample Source 08/25/2023 NOT GIVEN   Final   STATUS: 08/25/2023 FINAL   Final   Result: 08/25/2023 No Growth   Final  Office Visit on 10/14/2022  Component Date Value Ref Range Status   Sodium 10/14/2022 140  135 - 145 mEq/L Final   Potassium 10/14/2022 3.7  3.5 - 5.1 mEq/L Final   Chloride 10/14/2022 106  96 - 112 mEq/L Final   CO2 10/14/2022 29  19 - 32 mEq/L Final   Glucose, Bld 10/14/2022 84  70 - 99 mg/dL Final   BUN 54/12/8117 10  6 - 23 mg/dL Final   Creatinine, Ser 10/14/2022 1.29  0.40 - 1.50 mg/dL Final   Total Bilirubin 10/14/2022 0.9  0.2 - 1.2 mg/dL Final   Alkaline  Phosphatase 10/14/2022 77  39 - 117 U/L Final   AST 10/14/2022 42 (H)  0 - 37 U/L Final   ALT 10/14/2022 41  0 - 53 U/L Final   Total Protein 10/14/2022 7.2  6.0 - 8.3 g/dL Final   Albumin 14/78/2956 4.1  3.5 - 5.2 g/dL Final   GFR 21/30/8657 67.11  >60.00 mL/min Final   Calcium 10/14/2022 9.4  8.4 - 10.5 mg/dL Final  Lab on 84/69/6295  Component Date Value Ref Range Status   Sodium 09/02/2022 139  135 - 145 mEq/L Final   Potassium 09/02/2022 4.1  3.5 - 5.1 mEq/L Final   Chloride 09/02/2022 103  96 - 112 mEq/L Final   CO2 09/02/2022 29  19 - 32 mEq/L Final   Glucose, Bld 09/02/2022 75  70 - 99 mg/dL Final   BUN 28/41/3244 13  6 - 23 mg/dL Final   Creatinine, Ser 09/02/2022 1.44  0.40 - 1.50 mg/dL Final   Total Bilirubin 09/02/2022 1.1  0.2 - 1.2 mg/dL Final   Alkaline Phosphatase 09/02/2022 86  39 - 117 U/L Final   AST 09/02/2022 50 (H)  0 - 37 U/L Final   ALT 09/02/2022 39  0 - 53 U/L Final   Total Protein 09/02/2022 7.1  6.0 - 8.3 g/dL Final   Albumin 04/24/7251 4.2  3.5 - 5.2 g/dL Final   GFR 66/44/0347 58.86 (L)  >60.00 mL/min Final   Calcium 09/02/2022 9.6  8.4 - 10.5 mg/dL Final  Office Visit on 08/21/2022  Component Date Value Ref Range Status   Cholesterol 08/21/2022 177  0 - 200 mg/dL Final   Triglycerides 42/59/5638 83.0  0.0 - 149.0 mg/dL Final   HDL 75/64/3329 55.20  >39.00 mg/dL Final   VLDL 51/88/4166 16.6  0.0 - 40.0 mg/dL Final   LDL Cholesterol 08/21/2022 105 (H)  0 - 99 mg/dL Final   Total CHOL/HDL Ratio 08/21/2022 3   Final   NonHDL 08/21/2022 121.73   Final   PSA 08/21/2022 1.54  0.10 - 4.00 ng/mL Final  TSH 08/21/2022 2.62  0.35 - 5.50 uIU/mL Final   Hgb A1c MFr Bld 08/21/2022 5.6  4.6 - 6.5 % Final   Sodium 08/21/2022 139  135 - 145 mEq/L Final   Potassium 08/21/2022 4.1  3.5 - 5.1 mEq/L Final   Chloride 08/21/2022 105  96 - 112 mEq/L Final   CO2 08/21/2022 26  19 - 32 mEq/L Final   Glucose, Bld 08/21/2022 78  70 - 99 mg/dL Final   BUN 16/01/9603 13  6  - 23 mg/dL Final   Creatinine, Ser 08/21/2022 1.40  0.40 - 1.50 mg/dL Final   Total Bilirubin 08/21/2022 0.7  0.2 - 1.2 mg/dL Final   Alkaline Phosphatase 08/21/2022 77  39 - 117 U/L Final   AST 08/21/2022 41 (H)  0 - 37 U/L Final   ALT 08/21/2022 37  0 - 53 U/L Final   Total Protein 08/21/2022 7.0  6.0 - 8.3 g/dL Final   Albumin 54/12/8117 4.1  3.5 - 5.2 g/dL Final   GFR 14/78/2956 60.89  >60.00 mL/min Final   Calcium 08/21/2022 9.2  8.4 - 10.5 mg/dL Final   WBC 21/30/8657 5.5  4.0 - 10.5 K/uL Final   RBC 08/21/2022 4.92  4.22 - 5.81 Mil/uL Final   Platelets 08/21/2022 239.0  150.0 - 400.0 K/uL Final   Hemoglobin 08/21/2022 15.1  13.0 - 17.0 g/dL Final   HCT 84/69/6295 44.3  39.0 - 52.0 % Final   MCV 08/21/2022 90.0  78.0 - 100.0 fl Final   MCHC 08/21/2022 34.0  30.0 - 36.0 g/dL Final   RDW 28/41/3244 13.0  11.5 - 15.5 % Final   Hepatitis C Ab 08/21/2022 NON-REACTIVE  NON-REACTIVE Final   HIV 1&2 Ab, 4th Generation 08/21/2022 NON-REACTIVE  NON-REACTIVE Final   Testosterone  08/21/2022 378.34  300.00 - 890.00 ng/dL Final  Appointment on 04/24/7251  Component Date Value Ref Range Status   Folate 04/03/2022 15.9  >5.9 ng/mL Final   Vitamin B-12 04/03/2022 388  211 - 911 pg/mL Final   CRP 04/03/2022 <1.0  0.5 - 20.0 mg/dL Final   Sed Rate 66/44/0347 8  0 - 15 mm/hr Final  Office Visit on 01/23/2022  Component Date Value Ref Range Status   WBC 01/23/2022 8.7  4.0 - 10.5 K/uL Final   RBC 01/23/2022 4.88  4.22 - 5.81 Mil/uL Final   Hemoglobin 01/23/2022 15.0  13.0 - 17.0 g/dL Final   HCT 42/59/5638 44.3  39.0 - 52.0 % Final   MCV 01/23/2022 90.7  78.0 - 100.0 fl Final   MCHC 01/23/2022 33.8  30.0 - 36.0 g/dL Final   RDW 75/64/3329 12.8  11.5 - 15.5 % Final   Platelets 01/23/2022 245.0  150.0 - 400.0 K/uL Final   Neutrophils Relative % 01/23/2022 74.5  43.0 - 77.0 % Final   Lymphocytes Relative 01/23/2022 15.9  12.0 - 46.0 % Final   Monocytes Relative 01/23/2022 7.0  3.0 - 12.0 % Final    Eosinophils Relative 01/23/2022 1.9  0.0 - 5.0 % Final   Basophils Relative 01/23/2022 0.7  0.0 - 3.0 % Final   Neutro Abs 01/23/2022 6.5  1.4 - 7.7 K/uL Final   Lymphs Abs 01/23/2022 1.4  0.7 - 4.0 K/uL Final   Monocytes Absolute 01/23/2022 0.6  0.1 - 1.0 K/uL Final   Eosinophils Absolute 01/23/2022 0.2  0.0 - 0.7 K/uL Final   Basophils Absolute 01/23/2022 0.1  0.0 - 0.1 K/uL Final   Sodium 01/23/2022 139  135 - 145  mEq/L Final   Potassium 01/23/2022 3.9  3.5 - 5.1 mEq/L Final   Chloride 01/23/2022 102  96 - 112 mEq/L Final   CO2 01/23/2022 30  19 - 32 mEq/L Final   Glucose, Bld 01/23/2022 82  70 - 99 mg/dL Final   BUN 16/01/9603 15  6 - 23 mg/dL Final   Creatinine, Ser 01/23/2022 0.98  0.40 - 1.50 mg/dL Final   Total Bilirubin 01/23/2022 0.7  0.2 - 1.2 mg/dL Final   Alkaline Phosphatase 01/23/2022 85  39 - 117 U/L Final   AST 01/23/2022 21  0 - 37 U/L Final   ALT 01/23/2022 22  0 - 53 U/L Final   Total Protein 01/23/2022 7.3  6.0 - 8.3 g/dL Final   Albumin 54/12/8117 4.2  3.5 - 5.2 g/dL Final   GFR 14/78/2956 93.80  >60.00 mL/min Final   Calcium 01/23/2022 9.5  8.4 - 10.5 mg/dL Final   Rheumatoid fact SerPl-aCnc 01/23/2022 <14  <14 IU/mL Final   Sed Rate 01/23/2022 15  0 - 15 mm/hr Final   TSH 01/23/2022 2.38  0.35 - 5.50 uIU/mL Final   Uric Acid, Serum 01/23/2022 5.1  4.0 - 7.8 mg/dL Final   VITD 21/30/8657 37.99  30.00 - 100.00 ng/mL Final   Ferritin 01/23/2022 82.8  22.0 - 322.0 ng/mL Final   Cyclic Citrullin Peptide Ab 84/69/6295 <16  UNITS Final   CRP 01/23/2022 <1.0  0.5 - 20.0 mg/dL Final   Anti Nuclear Antibody (ANA) 01/23/2022 POSITIVE (A)  NEGATIVE Final   ANA Titer 1 01/23/2022 1:40 (H)  titer Final   ANA Pattern 1 01/23/2022 Nuclear, Fine Speckled (A)   Final   ANA TITER 01/23/2022 1:40 (H)  titer Final   ANA PATTERN 01/23/2022 Cytoplasmic (A)   Final  No image results found. No results found.      08/25/2023    8:09 AM 10/14/2022    7:26 AM 08/21/2022    8:23 AM  01/15/2022    8:14 AM  PHQ 2/9 Scores  PHQ - 2 Score 0 0 0 0     Assessment & Plan Traumatic rupture of plantar fascia of right foot, initial encounter Partial Plantar Fascia Tear  due to basketball.   A popping sensation in the right foot during a basketball game led to pain and swelling in the arch, worsened by dorsiflexion. Assessment suggests a tendon issue rather than a bone fracture, as there is no point tenderness or significant pain upon direct pressure. The condition  is most consistent with  a partial tear of the plantar fascia, not a complete tear, based on pain distribution and response to physical examination. Conservative treatment is preferred initially, with surgical intervention considered only if necessary. MRI or ultrasound may assess the tear's extent if needed, but are not required unless conservative treatment fails. Prognosis varies and depends on adherence to rest and treatment, with potential for full function return if managed properly. Rest and ice application are advised. Ibuprofen is recommended for pain relief. Physical therapy is suggested for specific stretching protocols to prevent contracture. Orthotic support is advised to maintain the arch and prevent further injury. A walking boot is recommended for the first 1-2 weeks to prevent hyperextension. Aggressive stretching should be avoided in the first week. Referral to a podiatrist may be considered if conservative treatment fails. Follow-up is advised if symptoms do not improve.  Comprehensive recovery plan in AVS.  On the day of the visit, I dedicated 23 minutes* to both  direct and indirect patient care activities.  The time was spent:   Communication with Patient: I communicated the clinical situation to the patient, their family, or caregiver to keep them informed about the patient's health status Counseling and Education: I provided counseling and education to the patient, their family, or caregiver to empower them  in managing the patient's health Data Synthesis: I synthesized, documented, and evaluated the available clinical information in the Electronic Medical Record (EMR) to gain a comprehensive understanding of the patient's health Examination: I performed a medically appropriate examination to assess the patient's current health condition Patient History: I obtained, documented, and reviewed an updated medical history to understand any recent changes in the patient's health Treatment Plan: I worked collaboratively with the patient to develop and communicate an individualized treatment plan.   This time was spent independently of any separately billable procedures. Please note that this statement is intended to provide a clear and comprehensive account of the time and services provided during the patient's visit.  Data Analysis and Decision-Making: Performing extensive data analysis and complex treatment decision-making to provide personalized care.   *Total Encounter Time as defined by the Centers for Medicare and Medicaid Services includes, in addition to the face-to-face time of a patient visit (documented in the note above) non-face-to-face time: obtaining and reviewing outside history, ordering and reviewing medications, tests or procedures, care coordination (communications with other health care professionals or caregivers) and documentation in the medical record.     This document was synthesized by artificial intelligence ([employer]bridge) using HIPAA-compliant recording of the clinical interaction;   We discussed the use of AI scribe software for clinical note transcription with the patient, who gave verbal consent to proceed. additional Info: This encounter employed state-of-the-art, real-time, collaborative documentation. The patient actively reviewed and assisted in updating their electronic medical record on a shared screen, ensuring transparency and facilitating joint problem-solving for the problem  list, overview, and plan. This approach promotes accurate, informed care. The treatment plan was discussed and reviewed in detail, including medication safety, potential side effects, and all patient questions. We confirmed understanding and comfort with the plan. Follow-up instructions were established, including contacting the office for any concerns, returning if symptoms worsen, persist, or new symptoms develop, and precautions for potential emergency department visits.

## 2024-04-13 ENCOUNTER — Ambulatory Visit: Payer: Self-pay | Admitting: Family Medicine

## 2024-04-13 VITALS — BP 124/82 | HR 78 | Temp 97.2°F | Ht 75.0 in | Wt 242.1 lb

## 2024-04-13 DIAGNOSIS — R361 Hematospermia: Secondary | ICD-10-CM | POA: Diagnosis not present

## 2024-04-13 DIAGNOSIS — R300 Dysuria: Secondary | ICD-10-CM | POA: Diagnosis not present

## 2024-04-13 LAB — URINALYSIS, ROUTINE W REFLEX MICROSCOPIC
Bilirubin Urine: NEGATIVE
Hgb urine dipstick: NEGATIVE
Ketones, ur: NEGATIVE
Leukocytes,Ua: NEGATIVE
Nitrite: NEGATIVE
RBC / HPF: NONE SEEN
Specific Gravity, Urine: 1.015 (ref 1.000–1.030)
Total Protein, Urine: NEGATIVE
Urine Glucose: NEGATIVE
Urobilinogen, UA: 0.2 (ref 0.0–1.0)
pH: 6.5 (ref 5.0–8.0)

## 2024-04-13 LAB — COMPREHENSIVE METABOLIC PANEL WITH GFR
ALT: 34 U/L (ref 3–53)
AST: 49 U/L — ABNORMAL HIGH (ref 5–37)
Albumin: 4.5 g/dL (ref 3.5–5.2)
Alkaline Phosphatase: 77 U/L (ref 39–117)
BUN: 14 mg/dL (ref 6–23)
CO2: 31 meq/L (ref 19–32)
Calcium: 9.5 mg/dL (ref 8.4–10.5)
Chloride: 101 meq/L (ref 96–112)
Creatinine, Ser: 1.28 mg/dL (ref 0.40–1.50)
GFR: 67.03 mL/min
Glucose, Bld: 92 mg/dL (ref 70–99)
Potassium: 4.3 meq/L (ref 3.5–5.1)
Sodium: 140 meq/L (ref 135–145)
Total Bilirubin: 1 mg/dL (ref 0.2–1.2)
Total Protein: 7.1 g/dL (ref 6.0–8.3)

## 2024-04-13 LAB — CBC WITH DIFFERENTIAL/PLATELET
Basophils Absolute: 0 K/uL (ref 0.0–0.1)
Basophils Relative: 0.5 % (ref 0.0–3.0)
Eosinophils Absolute: 0.1 K/uL (ref 0.0–0.7)
Eosinophils Relative: 1.2 % (ref 0.0–5.0)
HCT: 44.8 % (ref 39.0–52.0)
Hemoglobin: 15.5 g/dL (ref 13.0–17.0)
Lymphocytes Relative: 27.7 % (ref 12.0–46.0)
Lymphs Abs: 1.4 K/uL (ref 0.7–4.0)
MCHC: 34.5 g/dL (ref 30.0–36.0)
MCV: 89.2 fl (ref 78.0–100.0)
Monocytes Absolute: 0.4 K/uL (ref 0.1–1.0)
Monocytes Relative: 7.5 % (ref 3.0–12.0)
Neutro Abs: 3.1 K/uL (ref 1.4–7.7)
Neutrophils Relative %: 63.1 % (ref 43.0–77.0)
Platelets: 245 K/uL (ref 150.0–400.0)
RBC: 5.03 Mil/uL (ref 4.22–5.81)
RDW: 12.8 % (ref 11.5–15.5)
WBC: 5 K/uL (ref 4.0–10.5)

## 2024-04-13 LAB — PSA: PSA: 1.5 ng/mL (ref 0.10–4.00)

## 2024-04-13 NOTE — Progress Notes (Signed)
 "  Subjective:     Patient ID: Matthew Holt, male    DOB: 1975-12-09, 48 y.o.   MRN: [id number]  Chief Complaint  Patient presents with   Penis Pain    This has been going on since before Thanksgiving    Discussed the use of AI scribe software for clinical note transcription with the patient, who gave verbal consent to proceed.  History of Present Illness Matthew Holt is a 48 year old male who presents with dysuria and hematospermia.  He has been experiencing dysuria since before Thanksgiving that began while traveling for work, intensifying towards the end of the trip. The pain was described as 'kind of intense' and started before Thanksgiving. Lab work for urinary tract infections returned negative results. STD testing was conducted  and returned negative results  He has only been with his wife for over 16 yrs. He has taken antibiotics, which seemed to alleviate some symptoms. No pain in the testicles. No f/c. No abd pain, no n/v  He is concerned about potential prostate issues, citing a family history of prostate cancer; his father was diagnosed in his late forties or early fifties. He reports no change in urinary stream but notes a decline in pain, although he still feels something is 'a little off' with some pressure causing pain in a specific area on bottom of distal end of shaft.  He experienced hematospermia approximately three to four weeks ago, which was 'noticeable' but not a lot. He has not noticed any blood since then. No pain with intercourse or discharge from the penis.  He reports increased frequency of urination, particularly in the morning, which he attributes to his high water intake and caffeine consumption due to his daily workouts. He denies getting up about once or twice at night to urinate. He takes various supplements, including pre-workouts and creatine, and has adjusted his diet to manage past ulcerative colitis symptoms, avoiding meat and certain beers and  supplements that trigger symptoms.    There are no preventive care reminders to display for this patient.   Past Medical History:  Diagnosis Date   Arthritis    Colitis    Dyslipidemia 09/05/2020   Ulcerative colitis with rectal bleeding [facility])     Past Surgical History:  Procedure Laterality Date   COLONOSCOPY     WISDOM TOOTH EXTRACTION      Current Medications[1]  Allergies[2] ROS neg/noncontributory except as noted HPI/below      Objective:     BP 124/82 (BP Location: Left Arm, Patient Position: Sitting, Cuff Size: Large)   Pulse 78   Temp (!) 97.2 F (36.2 C) (Temporal)   Ht 6' 3 (1.905 m)   Wt 242 lb 2 oz (109.8 kg)   BMI 30.26 kg/m  Wt Readings from Last 3 Encounters:  04/13/24 242 lb 2 oz (109.8 kg)  10/03/23 241 lb 12.8 oz (109.7 kg)  08/25/23 241 lb 12.8 oz (109.7 kg)    Physical Exam GENERAL: Well developed, well nourished, no acute distress. HEAD EYES EARS NOSE THROAT: Normocephalic, atraumatic, conjunctiva not injected, sclera nonicteric. ABDOMEN: Bowel sounds present, soft, non-tender, non-distended, no hepatosplenomegaly, no masses. EXTREMITIES: No edema. MUSCULOSKELETAL: No gross abnormalities. NEUROLOGICAL: Alert and oriented x3, cranial nerves II through XII intact. PSYCHIATRIC: Normal mood, good eye contact. GENITOURINARY: Prostate normal, testicles normal, tenderness at the tip of the bottom of distal end of shaft.  No lesions.  No ing nodes nor hernias.  Circ male.   Declined chaperone.  Assessment & Plan:  Hematospermia -     Urinalysis, Routine w reflex microscopic -     PSA -     CBC with Differential/Platelet -     Comprehensive metabolic panel with GFR -     Urine Culture  Dysuria -     Urinalysis, Routine w reflex microscopic -     PSA -     CBC with Differential/Platelet -     Comprehensive metabolic panel with GFR -     Urine Culture    Assessment and Plan Assessment & Plan Prostatitis with hematospermia and  dysuria   He experiences intermittent dysuria and hematospermia since before Thanksgiving, with negative UTI and STD tests. There is no change in urinary stream or testicular pain. Family history includes prostate cancer. Differential diagnosis includes prostatitis, urethritis, or low-grade prostate infection. A recent antibiotic course may have alleviated symptoms. He reports no current pain but has tenderness at the tip of the penis. Prostate exam is unremarkable. Discussed potential for inflammation or low-grade infection in the prostate. Explained that PSA elevation could be due to inflammation and does not necessarily indicate cancer. Hematospermia can occur with aggressive masturbation or intercourse and is not necessarily concerning. Ordered urine analysis and culture, and a PSA test. Referred to a urologist for further evaluation and advised follow-up with a urologist in [place]  if needed. Instructed to report any worsening symptoms.     Return if symptoms worsen or fail to improve.  Jenkins CHRISTELLA Carrel, MD     [1]  Current Outpatient Medications:    Ascorbic Acid (VITAMIN C) 1000 MG tablet, Take 1,000 mg by mouth daily., Disp: , Rfl:    Multiple Vitamins-Minerals (MULTIVITAMIN WITH MINERALS) tablet, Take 1 tablet by mouth daily., Disp: , Rfl:    Omega-3 Fatty Acids (FISH OIL PO), Take 1 tablet by mouth daily., Disp: , Rfl:    OVER THE COUNTER MEDICATION, Take 2 each by mouth daily in the afternoon. Brain supplement Nurevia, Disp: , Rfl:  [2]  Allergies Allergen Reactions   Azithromycin  Rash   "

## 2024-04-13 NOTE — Progress Notes (Signed)
 Labs fine except liver test slightly elevated by stable.  Make sure not drinking a lot of alcohol

## 2024-04-13 NOTE — Patient Instructions (Addendum)
 Merry Christmas  Let us  know on urologist

## 2024-04-13 NOTE — Progress Notes (Signed)
 Called pt left message .

## 2024-04-14 LAB — URINE CULTURE
MICRO NUMBER:: 17392053
Result:: NO GROWTH
SPECIMEN QUALITY:: ADEQUATE

## 2024-04-20 ENCOUNTER — Telehealth: Payer: Self-pay

## 2024-04-20 NOTE — Telephone Encounter (Signed)
 Copied from CRM #[id number]. Topic: General - Other >> Apr 14, 2024  9:11 AM Vena HERO wrote: Reason for CRM: Pt called in after see Dr Wendolyn yesterday and had some follow up questions for her. Please call pt to discuss  Called pt left message for return call

## 2024-08-26 ENCOUNTER — Encounter: Admitting: Family Medicine
